# Patient Record
Sex: Male | Born: 1935 | Race: White | Hispanic: No | Smoking: Never smoker
Health system: Southern US, Community
[De-identification: ages and names within clinical notes are randomized; demographics above are authoritative.]

## PROBLEM LIST (undated history)

## (undated) DIAGNOSIS — R001 Bradycardia, unspecified: Secondary | ICD-10-CM

## (undated) DIAGNOSIS — K219 Gastro-esophageal reflux disease without esophagitis: Secondary | ICD-10-CM

## (undated) DIAGNOSIS — I480 Paroxysmal atrial fibrillation: Secondary | ICD-10-CM

## (undated) DIAGNOSIS — I2510 Atherosclerotic heart disease of native coronary artery without angina pectoris: Secondary | ICD-10-CM

## (undated) DIAGNOSIS — I10 Essential (primary) hypertension: Secondary | ICD-10-CM

## (undated) DIAGNOSIS — E785 Hyperlipidemia, unspecified: Secondary | ICD-10-CM

## (undated) DIAGNOSIS — I214 Non-ST elevation (NSTEMI) myocardial infarction: Secondary | ICD-10-CM

## (undated) DIAGNOSIS — C61 Malignant neoplasm of prostate: Secondary | ICD-10-CM

## (undated) DIAGNOSIS — M1990 Unspecified osteoarthritis, unspecified site: Secondary | ICD-10-CM

## (undated) HISTORY — DX: Paroxysmal atrial fibrillation: I48.0

## (undated) HISTORY — PX: CHOLECYSTECTOMY: SHX55

## (undated) HISTORY — PX: PROSTATECTOMY: SHX69

## (undated) HISTORY — DX: Essential (primary) hypertension: I10

## (undated) HISTORY — DX: Malignant neoplasm of prostate: C61

## (undated) HISTORY — DX: Hyperlipidemia, unspecified: E78.5

## (undated) HISTORY — DX: Unspecified osteoarthritis, unspecified site: M19.90

## (undated) HISTORY — DX: Bradycardia, unspecified: R00.1

## (undated) HISTORY — DX: Atherosclerotic heart disease of native coronary artery without angina pectoris: I25.10

## (undated) HISTORY — PX: CORONARY ARTERY BYPASS GRAFT: SHX141

## (undated) HISTORY — DX: Non-ST elevation (NSTEMI) myocardial infarction: I21.4

---

## 1999-05-04 ENCOUNTER — Encounter: Admission: RE | Admit: 1999-05-04 | Discharge: 1999-07-11 | Payer: Self-pay | Admitting: Radiation Oncology

## 2005-12-08 ENCOUNTER — Ambulatory Visit: Payer: Self-pay | Admitting: Cardiology

## 2005-12-25 ENCOUNTER — Ambulatory Visit: Payer: Self-pay | Admitting: Cardiology

## 2010-01-27 ENCOUNTER — Inpatient Hospital Stay (HOSPITAL_COMMUNITY): Admission: EM | Admit: 2010-01-27 | Discharge: 2010-02-01 | Payer: Self-pay | Admitting: Emergency Medicine

## 2010-01-27 ENCOUNTER — Encounter: Payer: Self-pay | Admitting: Cardiology

## 2010-01-27 ENCOUNTER — Ambulatory Visit: Payer: Self-pay | Admitting: Cardiology

## 2010-01-28 ENCOUNTER — Encounter (INDEPENDENT_AMBULATORY_CARE_PROVIDER_SITE_OTHER): Payer: Self-pay | Admitting: Internal Medicine

## 2010-01-28 ENCOUNTER — Encounter: Payer: Self-pay | Admitting: Cardiology

## 2010-01-28 ENCOUNTER — Ambulatory Visit: Payer: Self-pay | Admitting: Vascular Surgery

## 2010-01-30 ENCOUNTER — Encounter: Payer: Self-pay | Admitting: Cardiology

## 2010-01-31 ENCOUNTER — Ambulatory Visit: Payer: Self-pay | Admitting: Cardiology

## 2010-02-01 ENCOUNTER — Encounter: Payer: Self-pay | Admitting: Cardiology

## 2010-02-01 ENCOUNTER — Other Ambulatory Visit: Payer: Self-pay | Admitting: Cardiology

## 2010-02-02 ENCOUNTER — Telehealth (INDEPENDENT_AMBULATORY_CARE_PROVIDER_SITE_OTHER): Payer: Self-pay | Admitting: *Deleted

## 2010-02-03 ENCOUNTER — Ambulatory Visit: Payer: Self-pay | Admitting: Cardiology

## 2010-02-15 ENCOUNTER — Ambulatory Visit: Payer: Self-pay | Admitting: Cardiology

## 2010-02-15 DIAGNOSIS — Z8546 Personal history of malignant neoplasm of prostate: Secondary | ICD-10-CM | POA: Insufficient documentation

## 2010-02-15 DIAGNOSIS — R55 Syncope and collapse: Secondary | ICD-10-CM | POA: Insufficient documentation

## 2010-02-15 DIAGNOSIS — I739 Peripheral vascular disease, unspecified: Secondary | ICD-10-CM

## 2010-02-15 DIAGNOSIS — R001 Bradycardia, unspecified: Secondary | ICD-10-CM

## 2010-02-15 DIAGNOSIS — Z951 Presence of aortocoronary bypass graft: Secondary | ICD-10-CM

## 2010-02-15 DIAGNOSIS — I10 Essential (primary) hypertension: Secondary | ICD-10-CM

## 2010-02-15 DIAGNOSIS — N393 Stress incontinence (female) (male): Secondary | ICD-10-CM

## 2010-02-15 DIAGNOSIS — I2510 Atherosclerotic heart disease of native coronary artery without angina pectoris: Secondary | ICD-10-CM

## 2010-02-28 ENCOUNTER — Encounter: Payer: Self-pay | Admitting: Cardiology

## 2010-03-24 ENCOUNTER — Ambulatory Visit: Payer: Self-pay | Admitting: Cardiology

## 2010-03-24 DIAGNOSIS — I5032 Chronic diastolic (congestive) heart failure: Secondary | ICD-10-CM

## 2010-05-06 ENCOUNTER — Ambulatory Visit: Payer: Self-pay | Admitting: Internal Medicine

## 2010-05-10 ENCOUNTER — Telehealth: Payer: Self-pay | Admitting: Internal Medicine

## 2010-05-10 ENCOUNTER — Encounter: Payer: Self-pay | Admitting: Internal Medicine

## 2010-05-11 HISTORY — PX: OTHER SURGICAL HISTORY: SHX169

## 2010-05-24 ENCOUNTER — Encounter: Payer: Self-pay | Admitting: Internal Medicine

## 2010-05-31 ENCOUNTER — Ambulatory Visit: Payer: Self-pay | Admitting: Internal Medicine

## 2010-05-31 ENCOUNTER — Ambulatory Visit (HOSPITAL_COMMUNITY): Admission: RE | Admit: 2010-05-31 | Discharge: 2010-06-01 | Payer: Self-pay | Admitting: Internal Medicine

## 2010-06-01 ENCOUNTER — Encounter: Payer: Self-pay | Admitting: Internal Medicine

## 2010-06-10 ENCOUNTER — Ambulatory Visit: Payer: Self-pay | Admitting: Internal Medicine

## 2010-09-09 ENCOUNTER — Ambulatory Visit: Payer: Self-pay | Admitting: Internal Medicine

## 2010-09-16 ENCOUNTER — Encounter: Payer: Self-pay | Admitting: Cardiology

## 2010-11-01 ENCOUNTER — Ambulatory Visit: Payer: Self-pay | Admitting: Cardiology

## 2011-01-12 NOTE — Cardiovascular Report (Signed)
Summary: Card Device Clinic/ INTERROGATION REPORT  Card Device Clinic/ INTERROGATION REPORT   Imported By: Dorise Hiss 09/12/2010 14:28:47  _____________________________________________________________________  External Attachment:    Type:   Image     Comment:   External Document

## 2011-01-12 NOTE — Letter (Signed)
Summary: Implantable Device Instructions  Architectural technologist, Main Office  [address]   [place], [place] [address]   Phone: [phone]  Fax: [phone]      Implantable Device Instructions  You are scheduled for:  __X___ Permanent Transvenous Pacemaker _____ Implantable Cardioverter Defibrillator _____ Implantable Loop Recorder _____ Generator Change  on May 31, 2010 with Dr. Johney Frame.  1.  Please arrive at the Short Stay Center at [facility] at 10 AM on the day of your procedure.  2.  Do not eat or drink after midnight the night before your procedure.  3.  Complete lab work on 05-24-10 at the [facility] in [place].   You do not have to be fasting.  4.  Do NOT take these medications for __5__ days prior to your procedure:  PLAVIX.    5.  Plan for an overnight stay.  Bring your insurance cards and a list of your medications.  6.  Wash your chest and neck with antibacterial soap (any brand) the evening before and the morning of your procedure.  Rinse well.   *If you have ANY questions after you get home, please call the office [phone].  *Every attempt is made to prevent procedures from being rescheduled.  Due to the nauture of Electrophysiology, rescheduling can happen.  The physician is always aware and directs the staff when this occurs.

## 2011-01-12 NOTE — Assessment & Plan Note (Signed)
Summary: EST- PER DEGENT FOR PPM   Visit Type:  Initial Consult Primary Provider:  Dimas Aguas  CC:  evaluate need for pacemaker.  History of Present Illness: Bradley Figueroa is an 77 year old male with history of CAD status post coronary bypass grafting who presents today for EP consultation regarding symptomatic bradycardia. The patient was seen several months ago because of an episode of presyncope associated with epigastric discomfort. The patient ruled in for non-ST elevation myocardial infarction.  He had a cath and medical management was recommended.  He was noted to have significant bradycardia with heart rates 30s during his hospitalization.  The patient reports having multiple episodes of fatigue and presyncope since his hospitalization.  A cardiac monitor was placed  and the patient is found to have occasional episodes of bradycardia during the daytime with heart rates in the low 30s. This was documented by auto-trigger however the patient does report several episodes of dizziness during the monitoring period. He has normal ejection fraction documented diastolic dysfunction by echocardiography.  He denies any chest pain orthopnea PND. He denies any palpitations.  He is otherwise without complaint today.  Preventive Screening-Counseling & Management  Alcohol-Tobacco     Smoking Status: never  Current Medications (verified): 1)  Isosorbide Mononitrate Cr 60 Mg Xr24h-Tab (Isosorbide Mononitrate) .... Take 1 Tablet By Mouth Once A Day 2)  Nitrostat 0.4 Mg Subl (Nitroglycerin) .... Use As Directed 3)  Protonix 40 Mg Tbec (Pantoprazole Sodium) .... Take 1 Tablet By Mouth Once A Day 4)  Simvastatin 40 Mg Tabs (Simvastatin) .... Take 1 Tablet By Mouth Once A Day 5)  Plavix 75 Mg Tabs (Clopidogrel Bisulfate) .... Take 1 Tablet By Mouth Once A Day 6)  Ativan 0.5 Mg Tabs (Lorazepam) .... Take 1 Tablet By Mouth Once A Day 7)  Colace 100 Mg Caps (Docusate Sodium) .... Take 1 Tablet By Mouth Once A Day 8)   Tums 500 Mg Chew (Calcium Carbonate Antacid) .... Take 2 Tablet By Mouth Once A Day 9)  Chlorthalidone 25 Mg Tabs (Chlorthalidone) .... Take 1 Tablet By Mouth Once A Day  Allergies (verified): 1)  ! Codeine  Comments:  Nurse/Medical Assistant: The patient's medications and allergies were reviewed with the patient and were updated in the Medication and Allergy Lists. List reviewed.  Past History:  Past Medical History: Status post aortocoronary bypass surgery x 4 in 1991by Dr. Particia Lather History of preserved left ventricular function Hyperlipidemia Hypertension History of vertigo Osteoarthrisit History of prostate cancer Symptomatic bradycardia  Past Surgical History: Reviewed history from 02/15/2010 and no changes required. CABG Cardiac Catheterization Cholecystectomy Prostatectomy  Family History: Reviewed history from 02/15/2010 and no changes required. Both parents are deceased with a history of CAD One sibiling has history of CAD  Social History: Reviewed history from 02/15/2010 and no changes required. Retired  Married  Alcohol Use - no Drug Use - no  Review of Systems       All systems are reviewed and negative except as listed in the HPI.   Vital Signs:  Patient profile:   77 year old male Height:      70 inches Weight:      214 pounds BMI:     30.82 Pulse rate:   52 / minute BP sitting:   126 / 62  (left arm) Cuff size:   regular  Vitals Entered By: Carlye Grippe (May 06, 2010 10:57 AM) CC: evaluate need for pacemaker   Physical Exam  General:  Well developed, well  nourished, in no acute distress. Head:  normocephalic and atraumatic Nose:  no deformity, discharge, inflammation, or lesions Mouth:  Teeth, gums and palate normal. Oral mucosa normal. Neck:  Neck supple, no JVD. No masses, thyromegaly or abnormal cervical nodes. Lungs:  Clear bilaterally to auscultation and percussion. Heart:  Non-displaced PMI, chest non-tender; regular  rate and rhythm, S1, S2 without murmurs, rubs or gallops. Carotid upstroke normal, no bruit. Normal abdominal aortic size, no bruits. Femorals normal pulses, no bruits. Pedals normal pulses. No edema, no varicosities. Abdomen:  Bowel sounds positive; abdomen soft and non-tender without masses, organomegaly, or hernias noted. No hepatosplenomegaly. Msk:  Back normal, normal gait. Muscle strength and tone normal. Pulses:  pulses normal in all 4 extremities Extremities:  1+ ble edema Neurologic:  Alert and oriented x 3. Skin:  Intact without lesions or rashes. Cervical Nodes:  no significant adenopathy Psych:  Normal affect.   EKG  Procedure date:  02/01/2010  Findings:      sinus bradycardia 47 bpm, PR 218,  QTc 412  Impression & Recommendations:  Problem # 1:  BRADYCARDIA (ICD-427.89) The patient presents today with symptomatic bradycardia.  I have reviewed his event monitor which reveals daytime heart rates as low as 30s.  His heart rates are mostly 40s-50s and he reports significant fatigue and frequent presyncope.  Risks, benefits, alternatives to PPM implantation were discussed in detail with the patient today.  He understands that the risks include but are not limited to bleeding, infection, pneumothorax, perforation, tamponade, vascular damage, renal failure, MI, stroke, death, and lead dislodgement. He accepts these risks and wishes to proceed.  We will schedule PPM at the next available time.  He will hold plavix x 5 days prior to the procedure.  Problem # 2:  CHRONIC DIASTOLIC HEART FAILURE (ICD-428.32) stable  Problem # 3:  HYPERTENSION (ICD-401.9) stable  Problem # 4:  CAD (ICD-414.00) stable

## 2011-01-12 NOTE — Procedures (Signed)
Summary: Holter and Event/ CARDIONET END OF SERVICE SUMMARY REPORT  Holter and Event/ CARDIONET END OF SERVICE SUMMARY REPORT   Imported By: Dorise Hiss 03/28/2010 09:34:56  _____________________________________________________________________  External Attachment:    Type:   Image     Comment:   External Document  Appended Document: Holter and Event/ CARDIONET END OF SERVICE SUMMARY REPORT Bradycardia. If recurrent dizziness  or presyncope- referral to EP. Routine f/u for now.  Appended Document: Holter and Event/ CARDIONET END OF SERVICE SUMMARY REPORT Left message to return call.   Appended Document: Holter and Event/ CARDIONET END OF SERVICE SUMMARY REPORT Patient notified.

## 2011-01-12 NOTE — Letter (Signed)
Summary: External Correspondence/ OFFICE VISIT DAYSPRING  External Correspondence/ OFFICE VISIT DAYSPRING   Imported By: Dorise Hiss 09/26/2010 10:48:43  _____________________________________________________________________  External Attachment:    Type:   Image     Comment:   External Document

## 2011-01-12 NOTE — Assessment & Plan Note (Signed)
Summary: 3 MO FU FROM [facility] 6/22 PER C [facility]ERG   Visit Type:  Follow-up Primary Provider:  Dimas Aguas   History of Present Illness: The patient presents today for routine electrophysiology followup. He reports doing very well since having his pacemaker implanted.  He denies further presyncope. The patient denies symptoms of palpitations, chest pain, shortness of breath, orthopnea, PND, lower extremity edema, presyncope, syncope, or neurologic sequela. The patient is tolerating medications without difficulties and is otherwise without complaint today.    Preventive Screening-Counseling & Management  Alcohol-Tobacco     Smoking Status: never  Current Medications (verified): 1)  Isosorbide Mononitrate Cr 60 Mg Xr24h-Tab (Isosorbide Mononitrate) .... Take 1 Tablet By Mouth Once A Day 2)  Nitrostat 0.4 Mg Subl (Nitroglycerin) .... Use As Directed 3)  Protonix 40 Mg Tbec (Pantoprazole Sodium) .... Take 1 Tablet By Mouth Once A Day 4)  Simvastatin 40 Mg Tabs (Simvastatin) .... Take 1 Tablet By Mouth Once A Day 5)  Plavix 75 Mg Tabs (Clopidogrel Bisulfate) .... Take 1 Tablet By Mouth Once A Day 6)  Ativan 0.5 Mg Tabs (Lorazepam) .... Take 1 Tablet By Mouth Once A Day 7)  Colace 100 Mg Caps (Docusate Sodium) .... Take 1 Tablet By Mouth Once A Day 8)  Tums 500 Mg Chew (Calcium Carbonate Antacid) .... Take 2 Tablet By Mouth Once A Day 9)  Chlorthalidone 25 Mg Tabs (Chlorthalidone) .... Take 1 Tablet By Mouth Once A Day  Allergies (verified): 1)  ! Codeine  Comments:  Nurse/Medical Assistant: The patient's medication list and allergies were reviewed with the patient and were updated in the Medication and Allergy Lists.  Past History:  Past Medical History: Status post aortocoronary bypass surgery x 4 in 1991by Dr. Particia Lather History of preserved left ventricular function Hyperlipidemia Hypertension History of vertigo Osteoarthrisit History of prostate cancer Symptomatic bradycardia s/p  PPM 6/11 by Dr Johney Frame  Past Surgical History: CABG Cardiac Catheterization Cholecystectomy Prostatectomy PPM 6/11  Vital Signs:  Patient profile:   76 year old male Height:      70 inches Weight:      219 pounds Pulse rate:   92 / minute BP sitting:   123 / 69  (left arm) Cuff size:   regular  Vitals Entered By: Carlye Grippe (September 09, 2010 2:10 PM)  Physical Exam  General:  Well developed, well nourished, in no acute distress. Head:  normocephalic and atraumatic Eyes:  PERRLA/EOM intact; conjunctiva and lids normal. Mouth:  Teeth, gums and palate normal. Oral mucosa normal. Neck:  supple Chest Wall:  pacemaker pocket is well healed Lungs:  Clear bilaterally to auscultation and percussion. Heart:  RRR Abdomen:  Bowel sounds positive; abdomen soft and non-tender without masses, organomegaly, or hernias noted. No hepatosplenomegaly. Msk:  Back normal, normal gait. Muscle strength and tone normal. Pulses:  pulses normal in all 4 extremities Extremities:  No clubbing or cyanosis. Neurologic:  Alert and oriented x 3.   PPM Specifications Following MD:  Hillis Range, MD     PPM Vendor:  Medtronic     PPM Model Number:  ADDRL1     PPM Serial Number:  [id number] PPM DOI:  05/31/2010     PPM Implanting MD:  Hillis Range, MD  Lead 1    Location: RA     DOI: 05/31/2010     Model #: 2440     Serial #: [id number]     Status: active Lead 2    Location: RV  DOI: 05/31/2010     Model #: 1610     Serial #: [id number]     Status: active  PPM Follow Up Battery Voltage:  2.79 V     Battery Est. Longevity:  10.5 yrs       PPM Device Measurements Atrium  Amplitude: 2.80 mV, Impedance: 617 ohms, Threshold: 0.750 V at 0.40 msec Right Ventricle  Amplitude: 31.36 mV, Impedance: 571 ohms, Threshold: 0.50 V at 0.40 msec  Episodes MS Episodes:  0     Ventricular High Rate:  0     Atrial Pacing:  97.4%     Ventricular Pacing:  0.2%  Parameters Mode:  MVP     Lower Rate Limit:  60      Upper Rate Limit:  120 Paced AV Delay:  180     Sensed AV Delay:  150 Next Cardiology Appt Due:  02/13/2011 Tech Comments:  NORMAL DEVICE FUNCTION.  NO EPISODES SINCE LAST CHECK.  CHANGED RA OUTPUT FROM 3.50 TO 2.00 AND RV OUTPUT FROM 3.50 TO 2.50 V. ROV IN JUNE 2012. Vella Kohler  September 09, 2010 2:59 PM MD Comments:  agree  Impression & Recommendations:  Problem # 1:  BRADYCARDIA (ICD-427.89) s/p PPM doing well as above  Problem # 2:  HYPERTENSION (ICD-401.9) stable no changes today  Problem # 3:  CAD (ICD-414.00) no ischemic symptoms no changes  Patient Instructions: 1)  return 6/12  Appended Document: [facility]     Hypertension History:      Positive major cardiovascular risk factors include male age 59 years old or older and hypertension.  Negative major cardiovascular risk factors include non-tobacco-user status.        Positive history for target organ damage include ASHD (either angina/prior MI/prior CABG), cardiac end organ damage (either CHF or LVH), and peripheral vascular disease.     Allergies: 1)  ! Codeine   PPM Specifications Following MD:  Hillis Range, MD     PPM Vendor:  Medtronic     PPM Model Number:  ADDRL1     PPM Serial Number:  [id number] PPM DOI:  05/31/2010     PPM Implanting MD:  Hillis Range, MD  Lead 1    Location: RA     DOI: 05/31/2010     Model #: 6213     Serial #: [id number]     Status: active Lead 2    Location: RV     DOI: 05/31/2010     Model #: 2952     Serial #: [id number]     Status: active  Parameters Mode:  MVP     Lower Rate Limit:  60     Upper Rate Limit:  120 Paced AV Delay:  180     Sensed AV Delay:  150  Hypertension Assessment/Plan:      The patient's hypertensive risk group is category C: Target organ damage and/or diabetes.   Prescriptions: CHLORTHALIDONE 25 MG TABS (CHLORTHALIDONE) Take 1 tablet by mouth once a day  #90 x 3   Entered by:   Cyril Loosen, RN, BSN   Authorized by:   Lewayne Bunting,  MD, [facility]   Signed by:   Cyril Loosen, RN, BSN on 09/09/2010   Method used:   Electronically to        [facility]* (retail)       [address]       [place], [place]  [address]  Ph: [phone]       Fax: [phone]   RxID:   [id number] PLAVIX 75 MG TABS (CLOPIDOGREL BISULFATE) Take 1 tablet by mouth once a day  #90 x 3   Entered by:   Cyril Loosen, RN, BSN   Authorized by:   Lewayne Bunting, MD, [facility]   Signed by:   Cyril Loosen, RN, BSN on 09/09/2010   Method used:   Electronically to        [facility]* (retail)       [address]       [place], [place]  [address]       Ph: [phone]       Fax: [phone]   RxID:   [id number] SIMVASTATIN 40 MG TABS (SIMVASTATIN) Take 1 tablet by mouth once a day  #90 x 3   Entered by:   Cyril Loosen, RN, BSN   Authorized by:   Lewayne Bunting, MD, [facility]   Signed by:   Cyril Loosen, RN, BSN on 09/09/2010   Method used:   Electronically to        [facility]* (retail)       [address]       [place], [place]  [address]       Ph: [phone]       Fax: [phone]   RxID:   [id number] NITROSTAT 0.4 MG SUBL (NITROGLYCERIN) use as directed  #25 x 3   Entered by:   Cyril Loosen, RN, BSN   Authorized by:   Lewayne Bunting, MD, [facility]   Signed by:   Cyril Loosen, RN, BSN on 09/09/2010   Method used:   Electronically to        [facility]* (retail)       [address]       [place], [place]  [address]       Ph: [phone]       Fax: [phone]   RxID:   [id number] ISOSORBIDE MONONITRATE CR 60 MG XR24H-TAB (ISOSORBIDE MONONITRATE) Take 1 tablet by mouth once a day  #90 x 3   Entered by:   Cyril Loosen, RN, BSN   Authorized by:   Lewayne Bunting, MD, [facility]   Signed by:   Cyril Loosen, RN, BSN on 09/09/2010   Method used:   Electronically to        [facility]* (retail)       [address]       [place], [place]  [address]        Ph: [phone]       Fax: [phone]   RxID:   [id number]

## 2011-01-12 NOTE — Miscellaneous (Signed)
Summary: Orders Update  Clinical Lists Changes  Orders: Added new Test order of T-Basic Metabolic Panel [phone]) - Signed Added new Test order of T-CBC w/Diff [phone]) - Signed Added new Test order of T-Protime, Auto ([phone]) - Signed Added new Test order of T-PTT ([id number]) - Signed

## 2011-01-12 NOTE — Assessment & Plan Note (Signed)
Summary: PACER-MEDTRONIC   Current Medications (verified): 1)  Isosorbide Mononitrate Cr 60 Mg Xr24h-Tab (Isosorbide Mononitrate) .... Take 1 Tablet By Mouth Once A Day 2)  Nitrostat 0.4 Mg Subl (Nitroglycerin) .... Use As Directed 3)  Protonix 40 Mg Tbec (Pantoprazole Sodium) .... Take 1 Tablet By Mouth Once A Day 4)  Simvastatin 40 Mg Tabs (Simvastatin) .... Take 1 Tablet By Mouth Once A Day 5)  Plavix 75 Mg Tabs (Clopidogrel Bisulfate) .... Take 1 Tablet By Mouth Once A Day 6)  Ativan 0.5 Mg Tabs (Lorazepam) .... Take 1 Tablet By Mouth Once A Day 7)  Colace 100 Mg Caps (Docusate Sodium) .... Take 1 Tablet By Mouth Once A Day 8)  Tums 500 Mg Chew (Calcium Carbonate Antacid) .... Take 2 Tablet By Mouth Once A Day 9)  Chlorthalidone 25 Mg Tabs (Chlorthalidone) .... Take 1 Tablet By Mouth Once A Day  Allergies (verified): 1)  ! Codeine   PPM Specifications Following MD:  Hillis Range, MD     PPM Vendor:  Medtronic     PPM Model Number:  ADDRL1     PPM Serial Number:  [id number] PPM DOI:  05/31/2010     PPM Implanting MD:  Hillis Range, MD  Lead 1    Location: RA     DOI: 05/31/2010     Model #: 7425     Serial #: [id number]     Status: active Lead 2    Location: RV     DOI: 05/31/2010     Model #: [id number]     Serial #: [id number]     Status: active  PPM Follow Up Battery Voltage:  2.79 V     Battery Est. Longevity:  77YRS       PPM Device Measurements Atrium  Amplitude: 2.80 mV, Impedance: 633 ohms, Threshold: 0.750 V at 0.40 msec Right Ventricle  Amplitude: 31.36 mV, Impedance: 652 ohms, Threshold: 0.750 V at 0.40 msec  Episodes MS Episodes:  0     Ventricular High Rate:  0     Atrial Pacing:  88.8%     Ventricular Pacing:  <0.1%  Parameters Mode:  MVP     Lower Rate Limit:  60     Upper Rate Limit:  120 Paced AV Delay:  180     Sensed AV Delay:  150 Tech Comments:  WOUND CHECK--STERI STRIPS REMOVED.  DR [facility] LOOKED AT SITE.  RECOMMENDED PUTTING 3 STERI STRIPS ON ONE SMALL  PLACE.  PT AWARE TO CALL IF SITE STARTS DRAINING.  NORMAL DEVICE FUNCTION.  PT FATIGUE AND SOME SOB.  RATE HISTOGRAMS FLAT--CHANGED ACTIVITY THRESHOLD FROM MEDIUM/LOW TO LOW AND ADL AND  EXERTION RESPONSE FROM 3 TO 4.  ROV 09-09-10 @ 1400 W/JA IN [place].  Vella Kohler  June 10, 2010 1:17 PM

## 2011-01-12 NOTE — Progress Notes (Signed)
Summary: Pt Call  Phone Note Call from Patient Call back at Home Phone [phone]   Summary of Call: Pt called the office stating he was dc'd from the hospital yesterday and was suppose to have a monitor put on. I received a call from Ward Givens, NP yesterday stating the patient needs a Cardionet monitor ordered. I have not been able to order monitor yet due to staffing in clinic. I notified pt that monitor will be ordered as soon as possible and will be sent via Fed Ex or UPS to him. He asked how he would put it on. I notified pt that there would be detailed instructions sent with the monitor regarding hook up and that the Cardionet company could also help him with hook up. He asked why he didn't have the monitor yet because he's nervous as a cat inside. I notified pt that due to staff in the office I had not been able to order the monitor yet and would do this as soon as possible. Pt became angry and stated "you can just come to my funeral then."  I again notified pt that I would order monitor as soon as possible but that if he felt he could not wait for the monitor to arrive or felt he needed immediate attention he should go to the ER. Pt asked how was he suppose to know if he should go and again stating that he was nervous inside. I notified pt that if he was having chest pain, SOB, palpitation or other problems that he felt needed to be addressed immediately, then he should go to the ER for evaluation. Pt then stated "bye, just bye" and disconnected the phone call.  Initial call taken by: Cyril Loosen, RN, BSN,  February 02, 2010 2:17 PM     Appended Document: Pt Call Pt received monitor today. His daughter, Terrilyn Saver [employer] teacher at [employer] to office today to ask if we placed monitor. Notified Mrs. Corum that monitor is shipped with detailed instructions for hook-up. She stated she would try to go to pt's home to hook up monitor for him as he is not able to do this on his own.    Also received voicemail from pt's nephew, Jillyn Hidden, who also asked if pt could have help with monitor. Notified Jillyn Hidden that Mrs. Corum stated she would hook up monitor.

## 2011-01-12 NOTE — Assessment & Plan Note (Signed)
Summary: 1 mo fu -srs   Visit Type:  Follow-up Primary Provider:  Dimas Aguas  CC:  follow-up visit.  History of Present Illness: the patient is an 77 year old male with history of there are disease, status post coronary bypass grafting. The patient was seen several months ago because of an episode of presyncope associated with epigastric discomfort. The patient ruled in for non-ST elevation myocardial infarction. He underwent cardiac catheterization it was felt that a stenosis at the insertion of the posterior lateral branch as well as an 80% stenosis within the posterolateral branch distal to the insertion of the bypass graft was a culprit lesion. This was felt to be a complex lesion was treated medically. Of note is that the patient's bypass grafts are more than 75 years old. Initial presentation the patient's heart rate was in the 30s. It was not clear whether this represents a vasovagal episode associated with epigastric discomfort. A cardiac monitor was placed in the interim the patient is found to have occasional episodes of bradycardia during the daytime heart rates in the low 30s. This was documented by auto-trigger however the patient does report several episodes of dizziness during the monitoring period.  The patient also reports more recently increased lower extremity edema. He has normal ejection fraction documented diastolic dysfunction by echocardiography. The patient has difficulty walking and does become easily short of breath. He denies any chest pain orthopnea PND. He denies any palpitations. He continued to endorse symptoms of dizziness and lightheadedness.  Clinical Review Panels:  CXR CXR results   Clinical Data: Chest pain and syncope.    PORTABLE CHEST - 1 VIEW    Comparison: None.    Findings: Prior median sternotomy noted.  Mild cardiomegaly is   present without edema.    The lungs appear clear.    IMPRESSION:    1.  Mild cardiomegaly, without edema.    Read By:   Dellia Cloud,  M.D.    (01/27/2010)  Echocardiogram Echocardiogram Transthoracic Echocardiography  Study Conclusions    - Left ventricle: The cavity size was normal. Wall thickness was     increased in a pattern of moderate LVH. Systolic function was     normal. The estimated ejection fraction was in the range of 55% to     60%. Doppler parameters are consistent with abnormal left     ventricular relaxation (grade 1 diastolic dysfunction).   - Aortic valve: Trivial regurgitation. Valve area: 3.93cm^2(VTI).     Valve area: 3.42cm^2 (Vmax).   - Left atrium: The atrium was mildly dilated.   Impressions:    - Technically difficult; LV function appears to be preserved; focal     wall motion abnormality cannot be excluded; suggest TEE or MUGA if     clinically indicated.   Olga Millers, MD, [facility] (01/28/2010)  Cardiac Imaging Cardiac Cath Findings   CARDIAC CATHETERIZATION   IMPRESSION:  This is an 77 year old with bradycardia, non-ST-elevation   myocardial infarction.  Left heart catheterization today shows severe   native coronary artery disease.  The left internal mammary artery to the   diagonal and the left anterior descending was patent.  The vein graft to   the obtuse marginal 1 and obtuse marginal 2 was totally occluded   ostially (the native circumflex does give rise to a patent obtuse   marginal).  There was a patent saphenous vein graft to the acute   marginal, PDA, and PLV.  The acute marginal branch was patent.  The PDA  touchdown   had been lost.  This graft ended on the PLV.  There was a   90% stenosis in the graft at the insertion site.  There was disease up   to 80% both proximally and distally at the touchdown site in the native   PLV.  The graft backfills several more proximal PLV branches.  The PDA   was fed by collaterals in the LAD.  LV systolic function was preserved.   I discussed the situation with Dr. Riley Kill.  There is a difficult   touchdown lesion  on the PLV with disease both proximally and distally in   the PLV and at the insertion site in the vein graft.  The territory   supplied is not very large.      PLAN:  For now will be medical management and we can readdress   intervention if the patient has ongoing symptoms.              Marca Ancona, MD  (01/31/2010)    Preventive Screening-Counseling & Management  Alcohol-Tobacco     Smoking Status: never  Current Medications (verified): 1)  Isosorbide Mononitrate Cr 60 Mg Xr24h-Tab (Isosorbide Mononitrate) .... Take 1 Tablet By Mouth Once A Day 2)  Nitrostat 0.4 Mg Subl (Nitroglycerin) .... Use As Directed 3)  Protonix 40 Mg Tbec (Pantoprazole Sodium) .... Take 1 Tablet By Mouth Once A Day 4)  Simvastatin 40 Mg Tabs (Simvastatin) .... Take 1 Tablet By Mouth Once A Day 5)  Plavix 75 Mg Tabs (Clopidogrel Bisulfate) .... Take 1 Tablet By Mouth Once A Day 6)  Ativan 0.5 Mg Tabs (Lorazepam) .... Take 1 Tablet By Mouth Once A Day 7)  Colace 100 Mg Caps (Docusate Sodium) .... Take 1 Tablet By Mouth Once A Day 8)  Tums 500 Mg Chew (Calcium Carbonate Antacid) .... Take 2 Tablet By Mouth Once A Day 9)  Chlorthalidone 25 Mg Tabs (Chlorthalidone) .... Take 1 Tablet By Mouth Once A Day  Allergies (verified): 1)  ! Codeine  Comments:  Nurse/Medical Assistant: The patient's medications and allergies were reviewed with the patient and were updated in the Medication and Allergy Lists. Verbally gave names.  Past History:  Past Medical History: Last updated: Mar 07, 2010 Status post aortocoronary bypass surgery x 4 in 1991by Dr. Particia Lather History of preserved left ventricular function Hyperlipidemia Hypertension History of vertigo Osteoarthrisit History of prostate cancer  Past Surgical History: Last updated: 03-07-2010 CABG Cardiac Catheterization Cholecystectomy Prostatectomy  Family History: Last updated: Mar 07, 2010 Both parents are deceased with a history of CAD One  sibiling has history of CAD  Social History: Last updated: 03/07/2010 Retired  Married  Alcohol Use - no Drug Use - no  Risk Factors: Smoking Status: never (03/24/2010)  Review of Systems       The patient complains of fatigue, incontinence, leg swelling, and dizziness.  The patient denies malaise, fever, weight gain/loss, vision loss, decreased hearing, hoarseness, chest pain, palpitations, shortness of breath, prolonged cough, wheezing, sleep apnea, coughing up blood, abdominal pain, blood in stool, nausea, vomiting, diarrhea, heartburn, blood in urine, muscle weakness, joint pain, rash, skin lesions, headache, fainting, depression, anxiety, enlarged lymph nodes, easy bruising or bleeding, and environmental allergies.    Vital Signs:  Patient profile:   77 year old male Height:      70 inches Weight:      227 pounds Pulse rate:   52 / minute BP sitting:   182 / 59  (left arm) Cuff  size:   large  Vitals Entered By: Carlye Grippe (March 24, 2010 9:20 AM) CC: follow-up visit   Physical Exam  Additional Exam:  General: Well-developed, well-nourished in no distress head: Normocephalic and atraumatic eyes PERRLA/EOMI intact, conjunctiva and lids normal nose: No deformity or lesions mouth normal dentition, normal posterior pharynx neck: Supple, no JVD.  No masses, thyromegaly or abnormal cervical nodes lungs: Normal breath sounds bilaterally without wheezing.  Normal percussion heart: regular rate and rhythm with normal S1 and S2, no S3 or S4.  PMI is normal.  No pathological murmurs abdomen: Normal bowel sounds, abdomen is soft and nontender without masses, organomegaly or hernias noted.  No hepatosplenomegaly musculoskeletal: Back normal, normal gait muscle strength and tone normal pulsus: Pulse is normal in all 4 extremities Extremities: 3+ peripheral pitting edema neurologic: Alert and oriented x 3 skin: Intact without lesions or rashes cervical nodes: No significant  adenopathy psychologic: Normal affect    Impression & Recommendations:  Problem # 1:  HYPERTENSION (ICD-401.9) the patient blood pressure is very poorly controlled. He appears to have associated diastolic heart failure. I started her chlorthalidone 25 mg p.o. q. daily. His updated medication list for this problem includes:    Chlorthalidone 25 Mg Tabs (Chlorthalidone) .Marland Kitchen... Take 1 tablet by mouth once a day  Problem # 2:  SYNCOPE AND COLLAPSE (ICD-780.2) the patient does have significant bradycardia with frequent heart rates below 40 beats per minute. It is not clearly documented whether he is symptomatic with these are not. we will refer patient to Dr. Johney Frame for possible evaluation for pacemaker implantation. However it is possible that the patient's dizzy episodes are vasovagal in nature. We will ask his opinion. His updated medication list for this problem includes:    Isosorbide Mononitrate Cr 60 Mg Xr24h-tab (Isosorbide mononitrate) .Marland Kitchen... Take 1 tablet by mouth once a day    Nitrostat 0.4 Mg Subl (Nitroglycerin) ..... Use as directed    Plavix 75 Mg Tabs (Clopidogrel bisulfate) .Marland Kitchen... Take 1 tablet by mouth once a day  Problem # 3:  POSTSURGICAL AORTOCORONARY BYPASS STATUS (ICD-V45.81) status post recent non-ST elevation myocardial infarction responding to medical therapy.  Problem # 4:  CHRONIC DIASTOLIC HEART FAILURE (ICD-428.32) the patient appears to have chronic diastolic heart failure. Diuretic has been started. His updated medication list for this problem includes:    Isosorbide Mononitrate Cr 60 Mg Xr24h-tab (Isosorbide mononitrate) .Marland Kitchen... Take 1 tablet by mouth once a day    Nitrostat 0.4 Mg Subl (Nitroglycerin) ..... Use as directed    Plavix 75 Mg Tabs (Clopidogrel bisulfate) .Marland Kitchen... Take 1 tablet by mouth once a day    Chlorthalidone 25 Mg Tabs (Chlorthalidone) .Marland Kitchen... Take 1 tablet by mouth once a day  Patient Instructions: 1)  Referral to Dr. Johney Frame 2)  Chlorthalidone  25mg  daily 3)  Follow up in  3 months 4)  (script sent to [facility], cancelled at [facility] at pt's request) Prescriptions: CHLORTHALIDONE 25 MG TABS (CHLORTHALIDONE) Take 1 tablet by mouth once a day  #30 x 6   Entered by:   Hoover Brunette, LPN   Authorized by:   Lewayne Bunting, MD, [facility]   Signed by:   Hoover Brunette, LPN on 16/09/9603   Method used:   Electronically to        [facility]* (retail)       [address]       [place], [place]  [address]  Ph: [phone]       Fax: [phone]   RxID:   [id number] CHLORTHALIDONE 25 MG TABS (CHLORTHALIDONE) Take 1 tablet by mouth once a day  #30 x 6   Entered by:   Hoover Brunette, LPN   Authorized by:   Lewayne Bunting, MD, [facility]   Signed by:   Hoover Brunette, LPN on 44/12/270   Method used:   Electronically to        [facility]* (retail)       [address]       [place], [place]  [address]       Ph: [phone]       Fax: [phone]   RxID:   [id number]   Handout requested.

## 2011-01-12 NOTE — Progress Notes (Signed)
Summary: PPM implant  Phone Note Outgoing Call Call back at [facility] Phone [phone]   Call placed by: Bradley Balsam RN BSN,  May 10, 2010 12:24 PM Summary of Call: Called patient and left message on machine to call about PPM implant. Bradley Balsam RN BSN  May 10, 2010 12:25 PM   Follow-up for Phone Call        PT DAUGHTER  RETURNED Bradley Figueroa [phone] or [phone] regarding her father Bradley Figueroa  May 11, 2010 2:05 PM [facility] with instructions and the time for 05/31/10 be at hospital at 10am labs on 05/24/10 at [facility] Dennis Bast, RN, BSN  May 12, 2010 11:22 AM

## 2011-01-12 NOTE — Cardiovascular Report (Signed)
Summary: Office Visit   Office Visit   Imported By: Roderic Ovens 07/07/2010 11:03:52  _____________________________________________________________________  External Attachment:    Type:   Image     Comment:   External Document

## 2011-01-12 NOTE — Consult Note (Signed)
Summary: Consultation Report  Consultation Report   Imported By: Dorise Hiss 02/03/2010 12:06:26  _____________________________________________________________________  External Attachment:    Type:   Image     Comment:   External Document

## 2011-01-12 NOTE — Assessment & Plan Note (Signed)
Summary: 6 MO F/U, PT DUE FOR 3 MO F/U IN JULY 2011-JM   Visit Type:  Follow-up Primary Provider:  Dimas Aguas   History of Present Illness: An 77 year old Caucasian male with history of CAD . He was hospitalized for non-ST-elevation MI earlier this year and was noted to have significant bradycardia with rates into the 30s during hospitalization. Following hospitalization, he has been experiencing fatigue and presyncope and event monitor showed bradycardia. He was seen by Dr. Johney Frame in the office on May 06, 2010, and decision was made to pursue permanent pacemaker placement. the patient has done well and reports no recurrent syncope.  Preventive Screening-Counseling & Management  Alcohol-Tobacco     Smoking Status: never  Current Medications (verified): 1)  Isosorbide Mononitrate Cr 60 Mg Xr24h-Tab (Isosorbide Mononitrate) .... Take 1 Tablet By Mouth Once A Day 2)  Nitrostat 0.4 Mg Subl (Nitroglycerin) .... Use As Directed 3)  Protonix 40 Mg Tbec (Pantoprazole Sodium) .... Take 1 Tablet By Mouth Once A Day 4)  Simvastatin 40 Mg Tabs (Simvastatin) .... Take 1 Tablet By Mouth Once A Day 5)  Plavix 75 Mg Tabs (Clopidogrel Bisulfate) .... Take 1 Tablet By Mouth Once A Day 6)  Ativan 0.5 Mg Tabs (Lorazepam) .... Take 1 Tablet By Mouth Once A Day 7)  Colace 100 Mg Caps (Docusate Sodium) .... Take 1 Tablet By Mouth Once A Day 8)  Tums 500 Mg Chew (Calcium Carbonate Antacid) .... Take 2 Tablet By Mouth Once A Day 9)  Chlorthalidone 25 Mg Tabs (Chlorthalidone) .... Take 1 Tablet By Mouth Once A Day 10)  Doxycycline Hyclate 100 Mg Caps (Doxycycline Hyclate) .... Take 1 Tablet By Mouth Once A Day With Food 11)  Aspir-Low 81 Mg Tbec (Aspirin) .... Take 1 Tablet By Mouth Once A Day  Allergies (verified): 1)  ! Codeine  Comments:  Nurse/Medical Assistant: The patient is currently on medications but does not know the name or dosage at this time. Instructed to contact our office with details.  Will update medication list at that time.  Past History:  Past Medical History: Last updated: 09/09/2010 Status post aortocoronary bypass surgery x 4 in 1991by Dr. 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 by Dr Johney Frame  Past Surgical History: Last updated: 09/09/2010 CABG Cardiac Catheterization Cholecystectomy Prostatectomy PPM 6/11  Family History: Last updated: 03/14/10 Both parents are deceased with a history of CAD One sibiling has history of CAD  Social History: Last updated: 2010-03-14 Retired  Married  Alcohol Use - no Drug Use - no  Risk Factors: Smoking Status: never (11/01/2010)  Vital Signs:  Patient profile:   77 year old male Height:      70 inches Weight:      218 pounds Pulse rate:   79 / minute BP sitting:   130 / 70  (left arm) Cuff size:   large  Vitals Entered By: Carlye Grippe (November 01, 2010 11:10 AM)  Physical Exam  Additional Exam:  General: Well-developed, well-nourished in no distress head: Normocephalic and atraumatic eyes PERRLA/EOMI intact, conjunctiva and lids normal nose: No deformity or lesions mouth normal dentition, normal posterior pharynx neck: Supple, no JVD.  No masses, thyromegaly or abnormal cervical nodes lungs: Normal breath sounds bilaterally without wheezing.  Normal percussion heart: regular rate and rhythm with normal S1 and S2, no S3 or S4.  PMI is normal.  No pathological murmurs abdomen: Normal  bowel sounds, abdomen is soft and nontender without masses, organomegaly or hernias noted.  No hepatosplenomegaly musculoskeletal: Back normal, normal gait muscle strength and tone normal pulsus: Pulse is normal in all 4 extremities Extremities: 3+ peripheral pitting edema neurologic: Alert and oriented x 3 skin: Intact without lesions or rashes cervical nodes: No  significant adenopathy psychologic: Normal affect    PPM Specifications Following MD:  Hillis Range, MD     PPM Vendor:  [employer]     PPM Model Number:  [id number]     PPM Serial Number:  [id number] PPM DOI:  05/31/2010     PPM Implanting MD:  Hillis Range, MD  Lead 1    Location: [facility]     DOI: 05/31/2010     Model #: [id number]     Serial #: [id number]     Status: active Lead 2    Location: RV     DOI: 05/31/2010     Model #: [id number]     Serial #: [id number]     Status: active  Parameters Mode:  MVP     Lower Rate Limit:  60     Upper Rate Limit:  120 Paced AV Delay:  180     Sensed AV Delay:  150  Impression & Recommendations:  Problem # 1:  BRADYCARDIA (ICD-427.89) status-post pacemaker implantation. His updated medication list for this problem includes:    Isosorbide Mononitrate Cr 60 Mg Xr24h-tab (Isosorbide mononitrate) .Marland Kitchen... Take 1 tablet by mouth once a day    Nitrostat 0.4 Mg Subl (Nitroglycerin) ..... Use as directed    Plavix 75 Mg Tabs (Clopidogrel bisulfate) .Marland Kitchen... Take 1 tablet by mouth once a day    Aspir-low 81 Mg Tbec (Aspirin) .Marland Kitchen... Take 1 tablet by mouth once a day  Problem # 2:  CHRONIC DIASTOLIC HEART FAILURE (ICD-428.32) no evidence of volume overload continue medical therapy His updated medication list for this problem includes:    Isosorbide Mononitrate Cr 60 Mg Xr24h-tab (Isosorbide mononitrate) .Marland Kitchen... Take 1 tablet by mouth once a day    Nitrostat 0.4 Mg Subl (Nitroglycerin) ..... Use as directed    Plavix 75 Mg Tabs (Clopidogrel bisulfate) .Marland Kitchen... Take 1 tablet by mouth once a day    Chlorthalidone 25 Mg Tabs (Chlorthalidone) .Marland Kitchen... Take 1 tablet by mouth once a day    Aspir-low 81 Mg Tbec (Aspirin) .Marland Kitchen... Take 1 tablet by mouth once a day  Problem # 3:  CAD (ICD-414.00) stable no recurrent chest pain.  Continue Plavix His updated medication list for this problem includes:    Isosorbide Mononitrate Cr 60 Mg Xr24h-tab (Isosorbide mononitrate) .Marland Kitchen... Take 1 tablet by mouth  once a day    Nitrostat 0.4 Mg Subl (Nitroglycerin) ..... Use as directed    Plavix 75 Mg Tabs (Clopidogrel bisulfate) .Marland Kitchen... Take 1 tablet by mouth once a day    Aspir-low 81 Mg Tbec (Aspirin) .Marland Kitchen... Take 1 tablet by mouth once a day  Patient Instructions: 1)  Your physician recommends that you continue on your current medications as directed. Please refer to the Current Medication list given to you today. 2)  Follow up in  6 months

## 2011-01-12 NOTE — Assessment & Plan Note (Signed)
Summary: EPH-PER C.BERG WITHIN 10 DAYS   Visit Type:  hospital follow-up Primary Provider:  Dimas Aguas  CC:  hospital follow-up visit.  History of Present Illness: the patient is an 77 year old male with prior history of coronary artery disease, status-post coronary bypass grafting.  On February 18 the patient while at [facility] developed epigastric discomfort followed by presyncope.  The patient ruled in for non-ST segment elevation myocardial infarction.  He underwent cardiac catheterization.  There was a 90% stenosis at the insertion of the posterior lateral branch with an 80% stenosis within the posterior lateral branch distal to the insertion site of the bypass graft.  This was felt to be a complex lesion and the patient was medically treated.  Of note is that his bypass graft was more than 75 years old.  Of note also was that the patient had initial presentation had a heart rate in the 30s.  Is not entirely clear if this was a vasovagal episode associated with this epigastric discomfort.  The patient reports some dizziness but no recurrent presyncope or syncope.  He also has chronic stable angina.  Preventive Screening-Counseling & Management  Alcohol-Tobacco     Smoking Status: never  Clinical Review Panels:  CXR CXR results   Clinical Data: Chest pain and syncope.    PORTABLE CHEST - 1 VIEW    Comparison: None.    Findings: Prior median sternotomy noted.  Mild cardiomegaly is   present without edema.    The lungs appear clear.    IMPRESSION:    1.  Mild cardiomegaly, without edema.    Read By:  Dellia Cloud,  M.D.    (01/27/2010)  Echocardiogram Echocardiogram Transthoracic Echocardiography  Study Conclusions    - Left ventricle: The cavity size was normal. Wall thickness was     increased in a pattern of moderate LVH. Systolic function was     normal. The estimated ejection fraction was in the range of 55% to     60%. Doppler parameters are consistent with  abnormal left     ventricular relaxation (grade 1 diastolic dysfunction).   - Aortic valve: Trivial regurgitation. Valve area: 3.93cm^2(VTI).     Valve area: 3.42cm^2 (Vmax).   - Left atrium: The atrium was mildly dilated.   Impressions:    - Technically difficult; LV function appears to be preserved; focal     wall motion abnormality cannot be excluded; suggest TEE or MUGA if     clinically indicated.   Olga Millers, MD, [facility] (01/28/2010)  Cardiac Imaging Cardiac Cath Findings   CARDIAC CATHETERIZATION   IMPRESSION:  This is an 78 year old with bradycardia, non-ST-elevation   myocardial infarction.  Left heart catheterization today shows severe   native coronary artery disease.  The left internal mammary artery to the   diagonal and the left anterior descending was patent.  The vein graft to   the obtuse marginal 1 and obtuse marginal 2 was totally occluded   ostially (the native circumflex does give rise to a patent obtuse   marginal).  There was a patent saphenous vein graft to the acute   marginal, PDA, and PLV.  The acute marginal branch was patent.  The PDA touchdown   had been lost.  This graft ended on the PLV.  There was a   90% stenosis in the graft at the insertion site.  There was disease up   to 80% both proximally and distally at the touchdown site in the native  PLV.  The graft backfills several more proximal PLV branches.  The PDA   was fed by collaterals in the LAD.  LV systolic function was preserved.   I discussed the situation with Dr. Riley Kill.  There is a difficult   touchdown lesion on the PLV with disease both proximally and distally in   the PLV and at the insertion site in the vein graft.  The territory   supplied is not very large.      PLAN:  For now will be medical management and we can readdress   intervention if the patient has ongoing symptoms.              Marca Ancona, MD  (01/31/2010)    Current Medications (verified): 1)  Isosorbide  Mononitrate Cr 60 Mg Xr24h-Tab (Isosorbide Mononitrate) .... Take 1 Tablet By Mouth Once A Day (Place On File) 2)  Nitrostat 0.4 Mg Subl (Nitroglycerin) .... Use As Directed 3)  Protonix 40 Mg Tbec (Pantoprazole Sodium) .... Take 1 Tablet By Mouth Once A Day 4)  Simvastatin 40 Mg Tabs (Simvastatin) .... Take 1 Tablet By Mouth Once A Day 5)  Plavix 75 Mg Tabs (Clopidogrel Bisulfate) .... Take 1 Tablet By Mouth Once A Day 6)  Ativan 0.5 Mg Tabs (Lorazepam) .... Take 1 Tablet By Mouth Once A Day 7)  Colace 100 Mg Caps (Docusate Sodium) .... Take 1 Tablet By Mouth Once A Day 8)  Tums 500 Mg Chew (Calcium Carbonate Antacid) .... Take 2 Tablet By Mouth Once A Day  Allergies (verified): 1)  ! Codeine  Comments:  Nurse/Medical Assistant: The patient's medications and allergies were reviewed with the patient and were updated in the Medication and Allergy Lists. List reviewed.  Past History:  Past Medical History: Last updated: 02/23/2010 Status post aortocoronary bypass surgery x 4 in 1991by Dr. Particia Lather History of preserved left ventricular function Hyperlipidemia Hypertension History of vertigo Osteoarthrisit History of prostate cancer  Past Surgical History: Last updated: 02-23-2010 CABG Cardiac Catheterization Cholecystectomy Prostatectomy  Family History: Last updated: 23-Feb-2010 Both parents are deceased with a history of CAD One sibiling has history of CAD  Social History: Last updated: 02-23-2010 Retired  Married  Alcohol Use - no Drug Use - no  Risk Factors: Smoking Status: never (Feb 23, 2010)  Social History: Smoking Status:  never  Review of Systems       The patient complains of chest pain and dizziness.  The patient denies fatigue, malaise, fever, weight gain/loss, vision loss, decreased hearing, hoarseness, palpitations, shortness of breath, prolonged cough, wheezing, sleep apnea, coughing up blood, abdominal pain, blood in stool, nausea, vomiting,  diarrhea, heartburn, incontinence, blood in urine, muscle weakness, joint pain, leg swelling, rash, skin lesions, headache, fainting, depression, anxiety, enlarged lymph nodes, easy bruising or bleeding, and environmental allergies.    Vital Signs:  Patient profile:   77 year old male Height:      70 inches Weight:      222 pounds Pulse rate:   48 / minute BP sitting:   146 / 76  (left arm) Cuff size:   large  Vitals Entered By: Carlye Grippe 02/23/2010 10:31 AM) CC: hospital follow-up visit   Physical Exam  Additional Exam:  General: Well-developed, well-nourished in no distress head: Normocephalic and atraumatic eyes PERRLA/EOMI intact, conjunctiva and lids normal nose: No deformity or lesions mouth normal dentition, normal posterior pharynx neck: Supple, no JVD.  No masses, thyromegaly or abnormal cervical nodes lungs: Normal breath  sounds bilaterally without wheezing.  Normal percussion heart: regular rate and rhythm with normal S1 and S2, no S3 or S4.  PMI is normal.  No pathological murmurs abdomen: Normal bowel sounds, abdomen is soft and nontender without masses, organomegaly or hernias noted.  No hepatosplenomegaly musculoskeletal: Back normal, normal gait muscle strength and tone normal pulsus: Pulse is normal in all 4 extremities Extremities: No peripheral pitting edema neurologic: Alert and oriented x 3 skin: Intact without lesions or rashes cervical nodes: No significant adenopathy psychologic: Normal affect    Impression & Recommendations:  Problem # 1:  CAD (ICD-414.00) the patient has significant coronary artery disease and is status post non-ST segment elevation myocardial infarction.  He will be continued on Plavix.  As well a statin drug therapy.isosorbide mononitrate was increased to 60 mg p.o. daily The following medications were removed from the medication list:    Aspir-low 81 Mg Tbec (Aspirin) .Marland Kitchen... Take 1 tablet by mouth once a day     Lisinopril 20 Mg Tabs (Lisinopril) .Marland Kitchen... Take 1 tablet by mouth once a day His updated medication list for this problem includes:    Isosorbide Mononitrate Cr 60 Mg Xr24h-tab (Isosorbide mononitrate) .Marland Kitchen... Take 1 tablet by mouth once a day (place on file)    Nitrostat 0.4 Mg Subl (Nitroglycerin) ..... Use as directed    Plavix 75 Mg Tabs (Clopidogrel bisulfate) .Marland Kitchen... Take 1 tablet by mouth once a day  Problem # 2:  SYNCOPE AND COLLAPSE (ICD-780.2) the patient had presyncope associated with heart rate in the 30s.  Reportedly the patient had a CardioNet monitor and we will try to obtain those results. The following medications were removed from the medication list:    Aspir-low 81 Mg Tbec (Aspirin) .Marland Kitchen... Take 1 tablet by mouth once a day    Lisinopril 20 Mg Tabs (Lisinopril) .Marland Kitchen... Take 1 tablet by mouth once a day His updated medication list for this problem includes:    Isosorbide Mononitrate Cr 60 Mg Xr24h-tab (Isosorbide mononitrate) .Marland Kitchen... Take 1 tablet by mouth once a day (place on file)    Nitrostat 0.4 Mg Subl (Nitroglycerin) ..... Use as directed    Plavix 75 Mg Tabs (Clopidogrel bisulfate) .Marland Kitchen... Take 1 tablet by mouth once a day  Problem # 3:  HYPERTENSION (ICD-401.9) Assessment: Comment Only  The following medications were removed from the medication list:    Aspir-low 81 Mg Tbec (Aspirin) .Marland Kitchen... Take 1 tablet by mouth once a day    Lisinopril 20 Mg Tabs (Lisinopril) .Marland Kitchen... Take 1 tablet by mouth once a day  Problem # 4:  POSTSURGICAL AORTOCORONARY BYPASS STATUS (ICD-V45.81) Assessment: Comment Only  Patient Instructions: 1)  Increase Imdur to 60mg  daily 2)  Follow up in  3-4 weeks.   Prescriptions: ISOSORBIDE MONONITRATE CR 60 MG XR24H-TAB (ISOSORBIDE MONONITRATE) Take 1 tablet by mouth once a day (place on file)  #30 x 6   Entered by:   Hoover Brunette, LPN   Authorized by:   Lewayne Bunting, MD, [facility]   Signed by:   Hoover Brunette, LPN on 16/09/9603   Method used:   Electronically to         [facility]* (retail)       [address]       [place], [place]  [address]       Ph: [phone]       Fax: [phone]   RxID:   [id number]

## 2011-01-12 NOTE — Cardiovascular Report (Signed)
Summary: Pre-Op Orders  Pre-Op Orders   Imported By: Debby Freiberg 06/04/2010 11:03:26  _____________________________________________________________________  External Attachment:    Type:   Image     Comment:   External Document

## 2011-03-02 LAB — COMPREHENSIVE METABOLIC PANEL
ALT: 15 U/L (ref 0–53)
AST: 31 U/L (ref 0–37)
BUN: 26 mg/dL — ABNORMAL HIGH (ref 6–23)
Calcium: 9.7 mg/dL (ref 8.4–10.5)
Creatinine, Ser: 1.15 mg/dL (ref 0.4–1.5)
GFR calc Af Amer: >60 mL/min (ref >60)
GFR calc non Af Amer: >60 mL/min (ref >60)
Glucose, Bld: 173 mg/dL — ABNORMAL HIGH (ref 70–99)
Total Bilirubin: 0.4 mg/dL (ref 0.3–1.2)

## 2011-03-02 LAB — POCT CARDIAC MARKERS
CKMB, poc: 4.1 ng/mL (ref 1.0–8.0)
Myoglobin, poc: 477 ng/mL (ref 12–200)

## 2011-03-02 LAB — DIFFERENTIAL
Eosinophils Absolute: 0 10*3/uL (ref 0.0–0.7)
Lymphocytes Relative: 7 % — ABNORMAL LOW (ref 12–46)
Neutrophils Relative %: 92 % — ABNORMAL HIGH (ref 43–77)

## 2011-03-02 LAB — URINALYSIS, ROUTINE W REFLEX MICROSCOPIC
Glucose, UA: NEGATIVE mg/dL
Ketones, ur: NEGATIVE mg/dL
pH: 6 (ref 5.0–8.0)

## 2011-03-02 LAB — CBC
HCT: 37.8 % — ABNORMAL LOW (ref 39.0–52.0)
HCT: 40.3 % (ref 39.0–52.0)
HCT: 40.5 % (ref 39.0–52.0)
Hemoglobin: 13 g/dL (ref 13.0–17.0)
Hemoglobin: 13.3 g/dL (ref 13.0–17.0)
Hemoglobin: 13.7 g/dL (ref 13.0–17.0)
MCV: 95.4 fL (ref 78.0–100.0)
Platelets: 148 10*3/uL — ABNORMAL LOW (ref 150–400)
Platelets: 167 10*3/uL (ref 150–400)
RBC: 4.07 MIL/uL — ABNORMAL LOW (ref 4.22–5.81)
RDW: 13.2 % (ref 11.5–15.5)
RDW: 13.3 % (ref 11.5–15.5)
RDW: 13.7 % (ref 11.5–15.5)
WBC: 16.1 10*3/uL — ABNORMAL HIGH (ref 4.0–10.5)
WBC: 8.6 10*3/uL (ref 4.0–10.5)

## 2011-03-02 LAB — CULTURE, BLOOD (ROUTINE X 2)

## 2011-03-02 LAB — BASIC METABOLIC PANEL
BUN: 24 mg/dL — ABNORMAL HIGH (ref 6–23)
CO2: 25 mEq/L (ref 19–32)
Calcium: 8.3 mg/dL — ABNORMAL LOW (ref 8.4–10.5)
Chloride: 108 mEq/L (ref 96–112)
Chloride: 109 mEq/L (ref 96–112)
GFR calc Af Amer: >60 mL/min (ref >60)
GFR calc non Af Amer: 50 mL/min — ABNORMAL LOW (ref >60)
Potassium: 4.2 mEq/L (ref 3.5–5.1)
Potassium: 4.8 mEq/L (ref 3.5–5.1)
Sodium: 137 mEq/L (ref 135–145)
Sodium: 137 mEq/L (ref 135–145)

## 2011-03-02 LAB — URINE CULTURE: Special Requests: NEGATIVE

## 2011-03-02 LAB — CK TOTAL AND CKMB (NOT AT ARMC)
CK, MB: 9.6 ng/mL (ref 0.3–4.0)
Relative Index: 4.3 — ABNORMAL HIGH (ref 0.0–2.5)

## 2011-03-02 LAB — MAGNESIUM: Magnesium: 2.3 mg/dL (ref 1.5–2.5)

## 2011-03-02 LAB — CARDIAC PANEL(CRET KIN+CKTOT+MB+TROPI)
CK, MB: 2.4 ng/mL (ref 0.3–4.0)
CK, MB: 8 ng/mL (ref 0.3–4.0)
CK, MB: 8.9 ng/mL (ref 0.3–4.0)
Relative Index: 4.1 — ABNORMAL HIGH (ref 0.0–2.5)
Relative Index: 5.5 — ABNORMAL HIGH (ref 0.0–2.5)
Relative Index: 6.6 — ABNORMAL HIGH (ref 0.0–2.5)
Total CK: 219 U/L (ref 7–232)
Total CK: 31 U/L (ref 7–232)
Troponin I: 0.1 ng/mL — ABNORMAL HIGH (ref 0.00–0.06)
Troponin I: 0.12 ng/mL — ABNORMAL HIGH (ref 0.00–0.06)
Troponin I: 0.14 ng/mL — ABNORMAL HIGH (ref 0.00–0.06)

## 2011-03-02 LAB — PROTIME-INR
INR: 1.06 (ref 0.00–1.49)
Prothrombin Time: 13.7 seconds (ref 11.6–15.2)

## 2011-03-02 LAB — URINE MICROSCOPIC-ADD ON

## 2011-03-02 LAB — LIPID PANEL
Triglycerides: 59 mg/dL (ref <150)
VLDL: 12 mg/dL (ref 0–40)

## 2011-03-02 LAB — HEMOGLOBIN A1C: Mean Plasma Glucose: 117 mg/dL

## 2011-03-02 LAB — AMYLASE: Amylase: 29 U/L (ref 0–105)

## 2011-03-03 LAB — BASIC METABOLIC PANEL
GFR calc Af Amer: >60 mL/min (ref >60)
GFR calc non Af Amer: >60 mL/min (ref >60)
Glucose, Bld: 96 mg/dL (ref 70–99)
Potassium: 4.2 mEq/L (ref 3.5–5.1)
Sodium: 135 mEq/L (ref 135–145)

## 2011-03-03 LAB — CBC
HCT: 35.5 % — ABNORMAL LOW (ref 39.0–52.0)
Hemoglobin: 12.1 g/dL — ABNORMAL LOW (ref 13.0–17.0)
RBC: 3.74 MIL/uL — ABNORMAL LOW (ref 4.22–5.81)
RDW: 13.2 % (ref 11.5–15.5)

## 2011-04-17 DIAGNOSIS — R55 Syncope and collapse: Secondary | ICD-10-CM

## 2011-04-18 DIAGNOSIS — I2510 Atherosclerotic heart disease of native coronary artery without angina pectoris: Secondary | ICD-10-CM

## 2011-04-27 ENCOUNTER — Encounter: Payer: Self-pay | Admitting: Cardiology

## 2011-05-23 ENCOUNTER — Encounter: Payer: Self-pay | Admitting: Cardiology

## 2011-06-05 ENCOUNTER — Encounter: Payer: Self-pay | Admitting: Cardiology

## 2011-06-05 ENCOUNTER — Ambulatory Visit (INDEPENDENT_AMBULATORY_CARE_PROVIDER_SITE_OTHER): Payer: Medicare Other | Admitting: Cardiology

## 2011-06-05 VITALS — BP 132/69 | HR 76 | Ht 70.0 in | Wt 222.0 lb

## 2011-06-05 DIAGNOSIS — I2510 Atherosclerotic heart disease of native coronary artery without angina pectoris: Secondary | ICD-10-CM

## 2011-06-05 DIAGNOSIS — R55 Syncope and collapse: Secondary | ICD-10-CM

## 2011-06-05 DIAGNOSIS — I509 Heart failure, unspecified: Secondary | ICD-10-CM

## 2011-06-05 DIAGNOSIS — I5032 Chronic diastolic (congestive) heart failure: Secondary | ICD-10-CM

## 2011-06-05 MED ORDER — AMLODIPINE BESYLATE 5 MG PO TABS: 5.0000 mg | ORAL_TABLET | Freq: Every day | ORAL | Status: DC

## 2011-06-05 MED ORDER — ISOSORBIDE MONONITRATE ER 60 MG PO TB24: ORAL_TABLET | ORAL | Status: DC

## 2011-06-05 NOTE — Progress Notes (Signed)
HPI  The patient is an 77 year old male with history of coronary artery disease, status post coronary bypass grafting, hypertension hyperlipidemia and prior pacemaker placement. Has a history of diastolic congestive heart failure as well as prostate cancer. The patient was admitted last month because of a episode of syncope with the retrospect appear to be related to dehydration. Recommendations were to stop the diuretic. The patient has significant coronary artery disease with his last catheterization performed February 2011. The right coronary artery was totally occluded he had a 50% left main lesion the circumflex was patent. There was a 50% obtuse marginal and another marginal that had been grafted the native vessel is occluded. The LAD was occluded there was sequential saphenous vein graft to the acute marginal PDA and a posterolateral. The touchdown to the acute marginal was patent. The touchdown to the PDA was occluded in the touchdown to the posterolateral branch was patent. There was also 90% stenosis in the vein graft to the touchdown site. There was an 80% lesion in the posterior lateral branch. The saphenous vein graft to the first and second obtuse marginal was totally occluded. The LIMA to the diagonal and the LAD was patent. Recommendations were for medical therapy. During his recent hospitalization the patient underwent an echocardiogram which demonstrated a normal ejection fraction but the study was felt to be technically difficult. Ejection fraction was estimated at 55-60%. The patient also underwent a Cardiolite stress test demonstrating significant reversibility with moderate ischemia in a moderate area of the myocardium affecting the base and mid inferolateral wall. The patient also had a pacemaker evaluation the setting of his syncope. There appeared to be normal pacemaker function. The patient has a dual-chamber Medtronic device. Of note is that the patient state that prior to this  hospitalization he had significant angina. The patient's angina symptoms are associated with neck pain as well as bilateral jaw pain. This pattern has been relatively stable although slightly increased over the last several months. The patient states that he does not have to walk very far before he starts developing symptoms in the neck and jaw area. Although he has nitroglycerin is actually never used it.  Allergies  Allergen Reactions  . Codeine     Current Outpatient Prescriptions on File Prior to Visit  Medication Sig Dispense Refill  . aspirin (ASPIR-LOW) 81 MG EC tablet Take 81 mg by mouth daily.        . calcium carbonate (TUMS) 500 MG chewable tablet Chew 2 tablets by mouth daily.        . clopidogrel (PLAVIX) 75 MG tablet Take 75 mg by mouth daily.        Marland Kitchen docusate sodium (COLACE) 100 MG capsule Take 100 mg by mouth daily.        Marland Kitchen LORazepam (ATIVAN) 0.5 MG tablet Take 0.5 mg by mouth daily.        . nitroGLYCERIN (NITROSTAT) 0.4 MG SL tablet Place 0.4 mg under the tongue every 5 (five) minutes as needed.        . pantoprazole (PROTONIX) 40 MG tablet Take 40 mg by mouth daily.        . simvastatin (ZOCOR) 40 MG tablet Take 40 mg by mouth daily.        Marland Kitchen DISCONTD: chlorthalidone (HYGROTON) 25 MG tablet Take 25 mg by mouth daily.        Marland Kitchen DISCONTD: isosorbide mononitrate (IMDUR) 60 MG 24 hr tablet Take 60 mg by mouth daily.  Past Medical History  Diagnosis Date  . Hyperlipidemia   . Hypertension   . Vertigo   . Osteoarthritis   . Prostate cancer   . Symptomatic bradycardia     Past Surgical History  Procedure Date  . Coronary artery bypass graft   . Cardiac catheterization   . Cholecystectomy   . Prostatectomy   . Ppm 6/11    Family History  Problem Relation Age of Onset  . Coronary artery disease Mother   . Coronary artery disease Father   . Coronary artery disease Other     History   Social History  . Marital Status: Married    Spouse Name: N/A     Number of Children: N/A  . Years of Education: N/A   Occupational History  . Not on file.   Social History Main Topics  . Smoking status: Current Everyday Smoker  . Smokeless tobacco: Never Used  . Alcohol Use: No  . Drug Use: No  . Sexually Active: Not on file   Other Topics Concern  . Not on file   Social History Narrative   Retired and married.     BJY:NWGNFAOZH positives as outlined above. The remainder of the 18  point review of systems is negative  PHYSICAL EXAM BP 132/69  Pulse 76  Ht 5\' 10"  (1.778 m)  Wt 222 lb (100.699 kg)  BMI 31.85 kg/m2  General: Well-developed, well-nourished in no distress Head: Normocephalic and atraumatic Eyes:PERRLA/EOMI intact, conjunctiva and lids normal Ears: No deformity or lesions Mouth:normal dentition, normal posterior pharynx Neck: Supple, no JVD.  No masses, thyromegaly or abnormal cervical nodes Lungs: Normal breath sounds bilaterally without wheezing.  Normal percussion Chest: Well-healed midline sternotomy scar. Pocket site no evidence of infection or erythema. Cardiac: regular rate and rhythm with normal S1 and S2, no S3 or S4.  PMI is normal.  No pathological murmurs Abdomen: Normal bowel sounds, abdomen is soft and nontender without masses, organomegaly or hernias noted.  No hepatosplenomegaly MSK: Back normal, normal gait muscle strength and tone normal Vascular: Pulse is normal in all 4 extremities Extremities: No peripheral pitting edema Neurologic: Alert and oriented x 3 Skin: Intact without lesions or rashes Lymphatics: No significant adenopathy Psychologic: Normal affect   ECG: Not available  ASSESSMENT AND PLAN

## 2011-06-05 NOTE — Assessment & Plan Note (Signed)
Syncope was felt to be related secondary to dehydration. Pacemaker was interrogated. As per recommendations of Dr. Jens Som I stopped his diuretic also today. There was no pacemaker malfunction.

## 2011-06-05 NOTE — Patient Instructions (Signed)
Your physician wants you to follow-up in: 6 months. You will receive a reminder letter in the mail one-two months in advance. If you don't receive a letter, please call our office to schedule the follow-up appointment. Start Amlodipine 5 mg daily. Increase Imdur (isosorbide) to 90 mg daily. This will be 1 1/2 of your 60 mg tablets. A new prescription has been sent to [facility]. Stop Chlorthalidone.

## 2011-06-05 NOTE — Assessment & Plan Note (Addendum)
Significant coronary artery disease with a positive Cardiolite study for ischemia. The patient has exertional angina symptoms. Given his underlying renal insufficiency and is relatively stable pattern we'll continue with medical therapy. Also we are taking his advanced age into account. To improve his angina coverage at increased isosorbide mononitrate  to 90 mg daily and added Norvasc 5 mg by mouth daily. I also discontinued his diuretic to make sure he does not become hypotensive and it appears that his last episode of syncope was associated with dehydration. The patient does report some lower extremity edema usually late in the afternoon because he is sitting down in a chair most of the day. I told him that he should remain active but that this type of edema is not consistent with congestive heart failure. He also has problems with frequent urination and urinary incontinence. I also told the patient to make sure that he takes PRN nitroglycerin when needed

## 2011-06-05 NOTE — Assessment & Plan Note (Signed)
No evidence of heart failure. 

## 2011-06-26 ENCOUNTER — Other Ambulatory Visit: Payer: Self-pay | Admitting: *Deleted

## 2011-06-26 MED ORDER — AMLODIPINE BESYLATE 5 MG PO TABS: 5.0000 mg | ORAL_TABLET | Freq: Every day | ORAL | Status: DC

## 2011-08-04 ENCOUNTER — Encounter: Payer: Medicare Other | Admitting: Internal Medicine

## 2011-10-18 ENCOUNTER — Telehealth: Payer: Self-pay | Admitting: *Deleted

## 2011-10-18 NOTE — Telephone Encounter (Signed)
Patient had question about his Metoprolol.  States he has been taking Metoprolol Tart 25mg  1/2 tab twice a day.  His PMD did 90 day refill for him, but directions on bottle stated one tab daily.  Advised him to continue taking the way we have documented per last office note.  Scheduled him for 6 mo fu for 12/17 with GD.

## 2011-11-27 ENCOUNTER — Encounter: Payer: Self-pay | Admitting: Cardiology

## 2011-11-27 ENCOUNTER — Ambulatory Visit (INDEPENDENT_AMBULATORY_CARE_PROVIDER_SITE_OTHER): Payer: Medicare Other | Admitting: Cardiology

## 2011-11-27 VITALS — BP 140/78 | HR 84 | Ht 70.0 in | Wt 234.0 lb

## 2011-11-27 DIAGNOSIS — I5032 Chronic diastolic (congestive) heart failure: Secondary | ICD-10-CM

## 2011-11-27 DIAGNOSIS — R55 Syncope and collapse: Secondary | ICD-10-CM

## 2011-11-27 DIAGNOSIS — I509 Heart failure, unspecified: Secondary | ICD-10-CM

## 2011-11-27 DIAGNOSIS — I2510 Atherosclerotic heart disease of native coronary artery without angina pectoris: Secondary | ICD-10-CM

## 2011-11-27 MED ORDER — METOPROLOL TARTRATE 25 MG PO TABS: 25.0000 mg | ORAL_TABLET | Freq: Two times a day (BID) | ORAL | Status: DC

## 2011-11-27 MED ORDER — AMLODIPINE BESYLATE 10 MG PO TABS: 10.0000 mg | ORAL_TABLET | Freq: Every day | ORAL | Status: DC

## 2011-11-27 MED ORDER — CHLORTHALIDONE 25 MG PO TABS: 25.0000 mg | ORAL_TABLET | ORAL | Status: DC | PRN

## 2011-11-27 NOTE — Assessment & Plan Note (Signed)
Status post pacemaker. No clinical recurrent syncope.

## 2011-11-27 NOTE — Assessment & Plan Note (Signed)
The patient has lower extremity edema but this could be secondary to amlodipine. I did give him a prescription for chlorthalidone 25 mg to use only for the next 3 days. Thereafter he can use it when necessary as needed for edema. He has a history of chronic renal sufficiency and I don't want to place the patient on daily Lasix. I discussed this with his daughter and she is in agreement.

## 2011-11-27 NOTE — Progress Notes (Signed)
Bradley Bottoms, MD, [facility] Board Certified in Adult Cardiovascular Medicine,Internal Medicine and Critical Care Medicine    CC: Followup patient with coronary artery disease reporting chest pain  HPI:  The patient is a 77 year old male with history of coronary artery disease, status post coronary bypass grafting, hypertension, dyslipidemia and prior pacemaker placement. He also has history of diastolic heart failure. He had a catheterization February 2011 and the details are described in prior notes. He had a LIMA to the diagonal and LAD without was patent and the patient was given for him recommendation for medical therapy. Ejection fraction was stable at 55-60%. Medical therapy was optimized and his angina has improved. Since he was seen last in his office he had a single episode of angina which required 2 nitroglycerin. He does have some shortness of breath on exertion and walks with a cane. However his symptoms have remained stable. The patient continues to live by himself. Of note is that post catheterization Cardiolite was done which showed moderate inferior ischemia. During the last office visit we placed him on amlodipine as well as increase isosorbide mononitrate with apparent good clinical response and as outlined above with only a single episode of angina. He denies any orthopnea PND palpitations or syncope. He has noticed some lower extremity edema and is not clear whether this is due to increased volume or secondary to his amlodipine     PMH: reviewed and listed in Problem List in Electronic Records (and see below) Past Medical History  Diagnosis Date  . Hyperlipidemia   . Hypertension   . Vertigo   . Osteoarthritis   . Prostate cancer   . Symptomatic bradycardia    Past Surgical History  Procedure Date  . Coronary artery bypass graft   . Cardiac catheterization February 2011    . Cholecystectomy   . Prostatectomy   . Ppm 6/11      Allergies/SH/FHX : available  in Electronic Records for review  Medications: Current Outpatient Prescriptions  Medication Sig Dispense Refill  . amLODipine (NORVASC) 10 MG tablet Take 1 tablet (10 mg total) by mouth daily.  90 tablet  3  . aspirin (ASPIR-LOW) 81 MG EC tablet Take 81 mg by mouth daily.        . calcium carbonate (TUMS) 500 MG chewable tablet Chew 2 tablets by mouth daily.        . clopidogrel (PLAVIX) 75 MG tablet Take 75 mg by mouth daily.        Marland Kitchen docusate sodium (COLACE) 100 MG capsule Take 100 mg by mouth daily.        . isosorbide mononitrate (IMDUR) 60 MG 24 hr tablet Take 1 1/2 tablet by mouth once daily.  135 tablet  3  . LORazepam (ATIVAN) 0.5 MG tablet Take 0.5 mg by mouth daily.        . metoprolol tartrate (LOPRESSOR) 25 MG tablet Take 1 tablet (25 mg total) by mouth 2 (two) times daily.  180 tablet  3  . nitroGLYCERIN (NITROSTAT) 0.4 MG SL tablet Place 0.4 mg under the tongue every 5 (five) minutes as needed.        . pantoprazole (PROTONIX) 40 MG tablet Take 40 mg by mouth daily.        . simvastatin (ZOCOR) 40 MG tablet Take 40 mg by mouth daily.        . chlorthalidone (HYGROTON) 25 MG tablet Take 1 tablet (25 mg total) by mouth as needed.  30 tablet  0    ROS: No nausea or vomiting. No fever or chills.No melena or hematochezia.No bleeding.No claudication. Lower extremity edema  Physical Exam: BP 140/78  Pulse 84  Ht 5\' 10"  (1.778 m)  Wt 234 lb (106.142 kg)  BMI 33.58 kg/m2 General: Well-nourished white male in no apparent distress. Neck: Normal carotid upstroke and no carotid bruits no thyromegaly nonnodular thyroid. JVP is 6 cm. Lungs: Clear breath sounds bilaterally with no wheezing Cardiac: Regular rate and rhythm with normal S1-S2 no murmur rubs or gallop Vascular: 2+ pitting edema. Normal dorsalis pedis and posterior tibial pulses Skin: Warm and dry  12lead ECG: Not obtained Limited bedside ECHO:N/A   Assessment and Plan

## 2011-11-27 NOTE — Patient Instructions (Signed)
   Increase Metoprolol to 25mg  twice a day   Increase Amlodipine to 10mg  daily  Chlorthalidone 25mg  daily x 3 days only, then as needed for edema Your physician wants you to follow up in: 6 months.  You will receive a reminder letter in the mail one-two months in advance.  If you don't receive a letter, please call our office to schedule the follow up appointment

## 2011-11-27 NOTE — Assessment & Plan Note (Signed)
The patient has stable angina with a single episode of chest pain since his last office visit. We will further increase his amlodipine to 10 mg by mouth daily and also increased his metoprolol to 25 minutes by mouth twice a day. His blood pressure and heart rate allow [place] to do this. Hopefully this will resolve his angina altogether.

## 2011-12-18 DIAGNOSIS — M25569 Pain in unspecified knee: Secondary | ICD-10-CM | POA: Diagnosis not present

## 2011-12-18 DIAGNOSIS — M1710 Unilateral primary osteoarthritis, unspecified knee: Secondary | ICD-10-CM | POA: Diagnosis not present

## 2011-12-20 ENCOUNTER — Encounter: Payer: Self-pay | Admitting: Internal Medicine

## 2011-12-20 ENCOUNTER — Ambulatory Visit (INDEPENDENT_AMBULATORY_CARE_PROVIDER_SITE_OTHER): Payer: Medicare Other | Admitting: Internal Medicine

## 2011-12-20 DIAGNOSIS — I10 Essential (primary) hypertension: Secondary | ICD-10-CM | POA: Diagnosis not present

## 2011-12-20 DIAGNOSIS — I498 Other specified cardiac arrhythmias: Secondary | ICD-10-CM | POA: Diagnosis not present

## 2011-12-20 DIAGNOSIS — R001 Bradycardia, unspecified: Secondary | ICD-10-CM

## 2011-12-20 LAB — PACEMAKER DEVICE OBSERVATION
AL AMPLITUDE: 1.4 mv
BAMS-0001: 175 {beats}/min
BATTERY VOLTAGE: 2.8 V
RV LEAD AMPLITUDE: 22.4 mv
RV LEAD THRESHOLD: 0.75 V

## 2011-12-20 NOTE — Assessment & Plan Note (Signed)
Above goal today He reports good BP control at home.  I will therefore make no changes today, though he needs to follow his BP closely and contact our office if elevated.

## 2011-12-20 NOTE — Assessment & Plan Note (Signed)
Normal pacemaker function See Pace Art report No changes today  

## 2011-12-20 NOTE — Progress Notes (Signed)
PCP:  Criss Rosales, MD Primary Cardiologist:  DeGent  The patient presents today for routine electrophysiology followup.  Since last being seen in our clinic, the patient reports doing very well.  He remains quite active despite his age.  Today, he denies symptoms of palpitations, chest pain, shortness of breath, dizziness, presyncope, syncope, or neurologic sequela.  His edema is improved. The patient feels that he is tolerating medications without difficulties and is otherwise without complaint today.   Past Medical History  Diagnosis Date  . Hyperlipidemia   . Hypertension   . Vertigo   . Osteoarthritis   . Prostate cancer   . Symptomatic bradycardia     s/p PPM   Past Surgical History  Procedure Date  . Coronary artery bypass graft   . Cardiac catheterization   . Cholecystectomy   . Prostatectomy   . Ppm 6/11    Current Outpatient Prescriptions  Medication Sig Dispense Refill  . amLODipine (NORVASC) 10 MG tablet Take 1 tablet (10 mg total) by mouth daily.  90 tablet  3  . aspirin (ASPIR-LOW) 81 MG EC tablet Take 81 mg by mouth daily.        . calcium carbonate (TUMS) 500 MG chewable tablet Chew 2 tablets by mouth daily.        . chlorthalidone (HYGROTON) 25 MG tablet Take 1 tablet (25 mg total) by mouth as needed.  30 tablet  0  . clopidogrel (PLAVIX) 75 MG tablet Take 75 mg by mouth daily.        Marland Kitchen docusate sodium (COLACE) 100 MG capsule Take 100 mg by mouth daily.        . isosorbide mononitrate (IMDUR) 60 MG 24 hr tablet Take 1 1/2 tablet by mouth once daily.  135 tablet  3  . LORazepam (ATIVAN) 0.5 MG tablet Take 0.5 mg by mouth daily.        . metoprolol tartrate (LOPRESSOR) 25 MG tablet Take 1 tablet (25 mg total) by mouth 2 (two) times daily.  180 tablet  3  . nitroGLYCERIN (NITROSTAT) 0.4 MG SL tablet Place 0.4 mg under the tongue every 5 (five) minutes as needed.        . pantoprazole (PROTONIX) 40 MG tablet Take 40 mg by mouth daily.        . simvastatin (ZOCOR)  40 MG tablet Take 40 mg by mouth daily.          Allergies  Allergen Reactions  . Codeine     History   Social History  . Marital Status: Married    Spouse Name: N/A    Number of Children: N/A  . Years of Education: N/A   Occupational History  . Not on file.   Social History Main Topics  . Smoking status: Never Smoker   . Smokeless tobacco: Never Used  . Alcohol Use: No  . Drug Use: No  . Sexually Active: Not on file   Other Topics Concern  . Not on file   Social History Narrative   Retired and married.     Family History  Problem Relation Age of Onset  . Coronary artery disease Mother   . Coronary artery disease Father   . Coronary artery disease Other    Physical Exam: Filed Vitals:   12/20/11 1052  BP: 150/76  Pulse: 85  Height: 5\' 10"  (1.778 m)  Weight: 238 lb (107.956 kg)  SpO2: 98%    GEN- The patient is well appearing, alert and oriented  x 3 today.   Head- normocephalic, atraumatic Eyes-  Sclera clear, conjunctiva pink Ears- hearing intact Oropharynx- clear Neck- supple, no JVP Lymph- no cervical lymphadenopathy Lungs- Clear to ausculation bilaterally, normal work of breathing Chest- pacemaker pocket is well healed Heart- Regular rate and rhythm, no murmurs, rubs or gallops, PMI not laterally displaced GI- soft, NT, ND, + BS Extremities- no clubbing, cyanosis, or edema Neuro- strength and sensation are intact  Pacemaker interrogation- reviewed in detail today,  See PACEART report  Assessment and Plan:

## 2012-01-29 DIAGNOSIS — R21 Rash and other nonspecific skin eruption: Secondary | ICD-10-CM | POA: Diagnosis not present

## 2012-01-29 DIAGNOSIS — Z950 Presence of cardiac pacemaker: Secondary | ICD-10-CM | POA: Diagnosis not present

## 2012-01-29 DIAGNOSIS — Z951 Presence of aortocoronary bypass graft: Secondary | ICD-10-CM | POA: Diagnosis not present

## 2012-01-29 DIAGNOSIS — Z7902 Long term (current) use of antithrombotics/antiplatelets: Secondary | ICD-10-CM | POA: Diagnosis not present

## 2012-01-29 DIAGNOSIS — Z79899 Other long term (current) drug therapy: Secondary | ICD-10-CM | POA: Diagnosis not present

## 2012-01-29 DIAGNOSIS — Z7982 Long term (current) use of aspirin: Secondary | ICD-10-CM | POA: Diagnosis not present

## 2012-01-29 DIAGNOSIS — I2510 Atherosclerotic heart disease of native coronary artery without angina pectoris: Secondary | ICD-10-CM | POA: Diagnosis not present

## 2012-01-29 DIAGNOSIS — I872 Venous insufficiency (chronic) (peripheral): Secondary | ICD-10-CM | POA: Diagnosis not present

## 2012-01-31 DIAGNOSIS — M79609 Pain in unspecified limb: Secondary | ICD-10-CM | POA: Diagnosis not present

## 2012-01-31 DIAGNOSIS — M7989 Other specified soft tissue disorders: Secondary | ICD-10-CM | POA: Diagnosis not present

## 2012-01-31 DIAGNOSIS — L97909 Non-pressure chronic ulcer of unspecified part of unspecified lower leg with unspecified severity: Secondary | ICD-10-CM | POA: Diagnosis not present

## 2012-01-31 DIAGNOSIS — E669 Obesity, unspecified: Secondary | ICD-10-CM | POA: Diagnosis not present

## 2012-01-31 DIAGNOSIS — I10 Essential (primary) hypertension: Secondary | ICD-10-CM | POA: Diagnosis not present

## 2012-01-31 DIAGNOSIS — L97809 Non-pressure chronic ulcer of other part of unspecified lower leg with unspecified severity: Secondary | ICD-10-CM | POA: Diagnosis not present

## 2012-02-05 DIAGNOSIS — Z79899 Other long term (current) drug therapy: Secondary | ICD-10-CM | POA: Diagnosis not present

## 2012-02-05 DIAGNOSIS — Z7982 Long term (current) use of aspirin: Secondary | ICD-10-CM | POA: Diagnosis not present

## 2012-02-05 DIAGNOSIS — Z7902 Long term (current) use of antithrombotics/antiplatelets: Secondary | ICD-10-CM | POA: Diagnosis not present

## 2012-02-05 DIAGNOSIS — I872 Venous insufficiency (chronic) (peripheral): Secondary | ICD-10-CM | POA: Diagnosis not present

## 2012-02-05 DIAGNOSIS — R609 Edema, unspecified: Secondary | ICD-10-CM | POA: Diagnosis not present

## 2012-02-05 DIAGNOSIS — Z951 Presence of aortocoronary bypass graft: Secondary | ICD-10-CM | POA: Diagnosis not present

## 2012-02-05 DIAGNOSIS — Z950 Presence of cardiac pacemaker: Secondary | ICD-10-CM | POA: Diagnosis not present

## 2012-02-05 DIAGNOSIS — R21 Rash and other nonspecific skin eruption: Secondary | ICD-10-CM | POA: Diagnosis not present

## 2012-02-05 DIAGNOSIS — I2510 Atherosclerotic heart disease of native coronary artery without angina pectoris: Secondary | ICD-10-CM | POA: Diagnosis not present

## 2012-02-14 ENCOUNTER — Telehealth: Payer: Self-pay | Admitting: *Deleted

## 2012-02-14 DIAGNOSIS — I5032 Chronic diastolic (congestive) heart failure: Secondary | ICD-10-CM | POA: Diagnosis not present

## 2012-02-14 DIAGNOSIS — R079 Chest pain, unspecified: Secondary | ICD-10-CM

## 2012-02-14 DIAGNOSIS — K219 Gastro-esophageal reflux disease without esophagitis: Secondary | ICD-10-CM | POA: Diagnosis not present

## 2012-02-14 DIAGNOSIS — R5381 Other malaise: Secondary | ICD-10-CM | POA: Diagnosis not present

## 2012-02-14 DIAGNOSIS — Z7982 Long term (current) use of aspirin: Secondary | ICD-10-CM | POA: Diagnosis not present

## 2012-02-14 DIAGNOSIS — I2510 Atherosclerotic heart disease of native coronary artery without angina pectoris: Secondary | ICD-10-CM | POA: Diagnosis not present

## 2012-02-14 DIAGNOSIS — R0789 Other chest pain: Secondary | ICD-10-CM | POA: Diagnosis not present

## 2012-02-14 DIAGNOSIS — I517 Cardiomegaly: Secondary | ICD-10-CM | POA: Diagnosis not present

## 2012-02-14 DIAGNOSIS — Z8546 Personal history of malignant neoplasm of prostate: Secondary | ICD-10-CM | POA: Diagnosis not present

## 2012-02-14 DIAGNOSIS — E785 Hyperlipidemia, unspecified: Secondary | ICD-10-CM | POA: Diagnosis not present

## 2012-02-14 DIAGNOSIS — Z8249 Family history of ischemic heart disease and other diseases of the circulatory system: Secondary | ICD-10-CM | POA: Diagnosis not present

## 2012-02-14 DIAGNOSIS — I10 Essential (primary) hypertension: Secondary | ICD-10-CM | POA: Diagnosis not present

## 2012-02-14 DIAGNOSIS — J9819 Other pulmonary collapse: Secondary | ICD-10-CM | POA: Diagnosis not present

## 2012-02-14 DIAGNOSIS — N289 Disorder of kidney and ureter, unspecified: Secondary | ICD-10-CM | POA: Diagnosis not present

## 2012-02-14 DIAGNOSIS — I059 Rheumatic mitral valve disease, unspecified: Secondary | ICD-10-CM | POA: Diagnosis not present

## 2012-02-14 DIAGNOSIS — Z951 Presence of aortocoronary bypass graft: Secondary | ICD-10-CM | POA: Diagnosis not present

## 2012-02-14 DIAGNOSIS — I200 Unstable angina: Secondary | ICD-10-CM | POA: Diagnosis not present

## 2012-02-14 DIAGNOSIS — Z7902 Long term (current) use of antithrombotics/antiplatelets: Secondary | ICD-10-CM | POA: Diagnosis not present

## 2012-02-14 DIAGNOSIS — Z79899 Other long term (current) drug therapy: Secondary | ICD-10-CM | POA: Diagnosis not present

## 2012-02-14 DIAGNOSIS — Z950 Presence of cardiac pacemaker: Secondary | ICD-10-CM | POA: Diagnosis not present

## 2012-02-14 NOTE — Telephone Encounter (Signed)
Daughter Webb Laws) concerned about father - c/o chest pain & SOB, mostly with exertion.  States he has had some weakness & dizziness today.  Chest pain has lasted off/on x 4 days.  Usually takes NTG x 1 & is relieved.  States he also has c/o jaw pain intermittently.   States she has tried to get him to go to go to ED, but he refused.  Scheduled OV with Gene Serpe, PA for 3/15.  Encouraged her take him to PMD at the least for evaluation.  Advised her that if symptoms worsen, he should go directly to ED.  She verbalized understanding.

## 2012-02-15 ENCOUNTER — Other Ambulatory Visit: Payer: Self-pay | Admitting: Physician Assistant

## 2012-02-15 ENCOUNTER — Ambulatory Visit (HOSPITAL_COMMUNITY)
Admission: AD | Admit: 2012-02-15 | Payer: Medicare Other | Source: Other Acute Inpatient Hospital | Admitting: Cardiovascular Disease

## 2012-02-15 ENCOUNTER — Encounter (HOSPITAL_COMMUNITY)
Admission: AD | Disposition: A | Discharge status: Home / Self Care | Payer: Self-pay | Source: Other Acute Inpatient Hospital | Attending: Cardiology

## 2012-02-15 ENCOUNTER — Inpatient Hospital Stay (HOSPITAL_COMMUNITY)
Admission: AD | Admit: 2012-02-15 | Discharge: 2012-02-17 | DRG: 287 | Disposition: A | Discharge status: Home / Self Care | Payer: Medicare Other | Source: Other Acute Inpatient Hospital | Attending: Cardiology | Admitting: Cardiology

## 2012-02-15 ENCOUNTER — Encounter (HOSPITAL_COMMUNITY): Payer: Self-pay | Admitting: General Practice

## 2012-02-15 DIAGNOSIS — N393 Stress incontinence (female) (male): Secondary | ICD-10-CM | POA: Insufficient documentation

## 2012-02-15 DIAGNOSIS — I2582 Chronic total occlusion of coronary artery: Secondary | ICD-10-CM | POA: Diagnosis present

## 2012-02-15 DIAGNOSIS — I739 Peripheral vascular disease, unspecified: Secondary | ICD-10-CM | POA: Insufficient documentation

## 2012-02-15 DIAGNOSIS — I5032 Chronic diastolic (congestive) heart failure: Secondary | ICD-10-CM

## 2012-02-15 DIAGNOSIS — I498 Other specified cardiac arrhythmias: Secondary | ICD-10-CM | POA: Diagnosis present

## 2012-02-15 DIAGNOSIS — I214 Non-ST elevation (NSTEMI) myocardial infarction: Secondary | ICD-10-CM

## 2012-02-15 DIAGNOSIS — I10 Essential (primary) hypertension: Secondary | ICD-10-CM | POA: Insufficient documentation

## 2012-02-15 DIAGNOSIS — I2510 Atherosclerotic heart disease of native coronary artery without angina pectoris: Secondary | ICD-10-CM | POA: Diagnosis not present

## 2012-02-15 DIAGNOSIS — I200 Unstable angina: Secondary | ICD-10-CM | POA: Diagnosis present

## 2012-02-15 DIAGNOSIS — I509 Heart failure, unspecified: Secondary | ICD-10-CM | POA: Diagnosis not present

## 2012-02-15 DIAGNOSIS — R001 Bradycardia, unspecified: Secondary | ICD-10-CM | POA: Insufficient documentation

## 2012-02-15 DIAGNOSIS — K219 Gastro-esophageal reflux disease without esophagitis: Secondary | ICD-10-CM | POA: Diagnosis not present

## 2012-02-15 DIAGNOSIS — R079 Chest pain, unspecified: Secondary | ICD-10-CM | POA: Diagnosis not present

## 2012-02-15 DIAGNOSIS — E785 Hyperlipidemia, unspecified: Secondary | ICD-10-CM | POA: Diagnosis not present

## 2012-02-15 DIAGNOSIS — I25810 Atherosclerosis of coronary artery bypass graft(s) without angina pectoris: Secondary | ICD-10-CM | POA: Diagnosis present

## 2012-02-15 HISTORY — DX: Gastro-esophageal reflux disease without esophagitis: K21.9

## 2012-02-15 HISTORY — PX: LEFT HEART CATHETERIZATION WITH CORONARY/GRAFT ANGIOGRAM: SHX5450

## 2012-02-15 LAB — CREATININE, SERUM
Creatinine, Ser: 1.08 mg/dL (ref 0.50–1.35)
GFR calc Af Amer: 69 mL/min — ABNORMAL LOW (ref >90)
GFR calc non Af Amer: 60 mL/min — ABNORMAL LOW (ref >90)

## 2012-02-15 LAB — CBC
HCT: 40.7 % (ref 39.0–52.0)
Hemoglobin: 13.7 g/dL (ref 13.0–17.0)
RDW: 13.5 % (ref 11.5–15.5)
WBC: 8.5 10*3/uL (ref 4.0–10.5)

## 2012-02-15 LAB — GLUCOSE, CAPILLARY: Glucose-Capillary: 136 mg/dL — ABNORMAL HIGH (ref 70–99)

## 2012-02-15 SURGERY — LEFT HEART CATHETERIZATION WITH CORONARY/GRAFT ANGIOGRAM
Anesthesia: LOCAL

## 2012-02-15 MED ORDER — SODIUM CHLORIDE 0.9 % IV SOLN: INTRAVENOUS | Status: DC

## 2012-02-15 MED ORDER — MIDAZOLAM HCL 2 MG/2ML IJ SOLN
INTRAMUSCULAR | Status: AC
  Filled 2012-02-15: qty 2

## 2012-02-15 MED ORDER — ASPIRIN 81 MG PO TBEC: 81.0000 mg | DELAYED_RELEASE_TABLET | Freq: Every day | ORAL | Status: DC

## 2012-02-15 MED ORDER — PANTOPRAZOLE SODIUM 40 MG PO TBEC
40.0000 mg | DELAYED_RELEASE_TABLET | Freq: Every day | ORAL | Status: DC
  Administered 2012-02-16 – 2012-02-17 (×2): 40 mg via ORAL
  Filled 2012-02-15 (×4): qty 1

## 2012-02-15 MED ORDER — SIMVASTATIN 40 MG PO TABS: 40.0000 mg | ORAL_TABLET | Freq: Every day | ORAL | Status: DC

## 2012-02-15 MED ORDER — FENTANYL CITRATE 0.05 MG/ML IJ SOLN
INTRAMUSCULAR | Status: AC
  Filled 2012-02-15: qty 2

## 2012-02-15 MED ORDER — NITROGLYCERIN 0.2 MG/ML ON CALL CATH LAB
INTRAVENOUS | Status: AC
  Filled 2012-02-15: qty 1

## 2012-02-15 MED ORDER — ATORVASTATIN CALCIUM 40 MG PO TABS
40.0000 mg | ORAL_TABLET | Freq: Every day | ORAL | Status: DC
  Administered 2012-02-15 – 2012-02-16 (×2): 40 mg via ORAL
  Filled 2012-02-15 (×3): qty 1

## 2012-02-15 MED ORDER — HEPARIN (PORCINE) IN NACL 2-0.9 UNIT/ML-% IJ SOLN
INTRAMUSCULAR | Status: AC
  Filled 2012-02-15: qty 2000

## 2012-02-15 MED ORDER — SODIUM CHLORIDE 0.9 % IJ SOLN: 3.0000 mL | Freq: Two times a day (BID) | INTRAMUSCULAR | Status: DC

## 2012-02-15 MED ORDER — NITROGLYCERIN 0.4 MG SL SUBL: 0.4000 mg | SUBLINGUAL_TABLET | SUBLINGUAL | Status: DC | PRN

## 2012-02-15 MED ORDER — CALCIUM CARBONATE ANTACID 500 MG PO CHEW
2.0000 | CHEWABLE_TABLET | Freq: Every day | ORAL | Status: DC
  Administered 2012-02-17: 400 mg via ORAL
  Filled 2012-02-15 (×3): qty 2

## 2012-02-15 MED ORDER — DIAZEPAM 5 MG PO TABS
5.0000 mg | ORAL_TABLET | ORAL | Status: AC
  Administered 2012-02-15: 5 mg via ORAL
  Filled 2012-02-15: qty 1

## 2012-02-15 MED ORDER — METOPROLOL TARTRATE 12.5 MG HALF TABLET: 12.5000 mg | ORAL_TABLET | Freq: Two times a day (BID) | ORAL | Status: DC

## 2012-02-15 MED ORDER — SODIUM CHLORIDE 0.9 % IV SOLN: 250.0000 mL | INTRAVENOUS | Status: DC | PRN

## 2012-02-15 MED ORDER — ONDANSETRON HCL 4 MG/2ML IJ SOLN: 4.0000 mg | Freq: Four times a day (QID) | INTRAMUSCULAR | Status: DC | PRN

## 2012-02-15 MED ORDER — AMLODIPINE BESYLATE 10 MG PO TABS
10.0000 mg | ORAL_TABLET | Freq: Every day | ORAL | Status: DC
  Administered 2012-02-15 – 2012-02-17 (×3): 10 mg via ORAL
  Filled 2012-02-15 (×3): qty 1

## 2012-02-15 MED ORDER — GUAIFENESIN-DM 100-10 MG/5ML PO SYRP: 15.0000 mL | ORAL_SOLUTION | ORAL | Status: DC | PRN

## 2012-02-15 MED ORDER — CLOPIDOGREL BISULFATE 75 MG PO TABS: 75.0000 mg | ORAL_TABLET | Freq: Every day | ORAL | Status: DC

## 2012-02-15 MED ORDER — ISOSORBIDE MONONITRATE ER 30 MG PO TB24
30.0000 mg | ORAL_TABLET | Freq: Every day | ORAL | Status: DC
  Filled 2012-02-15: qty 1

## 2012-02-15 MED ORDER — SODIUM CHLORIDE 0.9 % IV SOLN: INTRAVENOUS | Status: AC

## 2012-02-15 MED ORDER — LORAZEPAM 1 MG PO TABS
0.5000 mg | ORAL_TABLET | Freq: Every day | ORAL | Status: DC
  Administered 2012-02-15 – 2012-02-16 (×2): 0.5 mg via ORAL
  Filled 2012-02-15 (×2): qty 1

## 2012-02-15 MED ORDER — MAGNESIUM HYDROXIDE 400 MG/5ML PO SUSP: 30.0000 mL | Freq: Every day | ORAL | Status: DC | PRN

## 2012-02-15 MED ORDER — CLOPIDOGREL BISULFATE 75 MG PO TABS
75.0000 mg | ORAL_TABLET | Freq: Every day | ORAL | Status: DC
  Administered 2012-02-16 – 2012-02-17 (×2): 75 mg via ORAL
  Filled 2012-02-15: qty 1

## 2012-02-15 MED ORDER — HEPARIN (PORCINE) IN NACL 100-0.45 UNIT/ML-% IJ SOLN
1040.0000 [IU]/h | INTRAMUSCULAR | Status: DC
  Filled 2012-02-15: qty 250

## 2012-02-15 MED ORDER — NITROGLYCERIN 2 % TD OINT: 0.5000 [in_us] | TOPICAL_OINTMENT | Freq: Four times a day (QID) | TRANSDERMAL | Status: DC

## 2012-02-15 MED ORDER — AMLODIPINE BESYLATE 5 MG PO TABS: 5.0000 mg | ORAL_TABLET | Freq: Every day | ORAL | Status: DC

## 2012-02-15 MED ORDER — SODIUM CHLORIDE 0.9 % IJ SOLN: 3.0000 mL | INTRAMUSCULAR | Status: DC | PRN

## 2012-02-15 MED ORDER — LIDOCAINE HCL (PF) 1 % IJ SOLN
INTRAMUSCULAR | Status: AC
  Filled 2012-02-15: qty 30

## 2012-02-15 MED ORDER — ASPIRIN 81 MG PO CHEW: 324.0000 mg | CHEWABLE_TABLET | ORAL | Status: DC

## 2012-02-15 MED ORDER — DOCUSATE SODIUM 100 MG PO CAPS
200.0000 mg | ORAL_CAPSULE | Freq: Every day | ORAL | Status: DC
  Administered 2012-02-16 – 2012-02-17 (×2): 200 mg via ORAL
  Filled 2012-02-15 (×3): qty 2

## 2012-02-15 MED ORDER — ASPIRIN EC 81 MG PO TBEC
81.0000 mg | DELAYED_RELEASE_TABLET | Freq: Every day | ORAL | Status: DC
  Administered 2012-02-16 – 2012-02-17 (×2): 81 mg via ORAL
  Filled 2012-02-15 (×2): qty 1

## 2012-02-15 MED ORDER — METOPROLOL TARTRATE 25 MG PO TABS
25.0000 mg | ORAL_TABLET | Freq: Two times a day (BID) | ORAL | Status: DC
  Administered 2012-02-15 – 2012-02-17 (×5): 25 mg via ORAL
  Filled 2012-02-15 (×6): qty 1

## 2012-02-15 MED ORDER — ACETAMINOPHEN 325 MG PO TABS: 650.0000 mg | ORAL_TABLET | ORAL | Status: DC | PRN

## 2012-02-15 MED ORDER — ACETAMINOPHEN 325 MG PO TABS
650.0000 mg | ORAL_TABLET | ORAL | Status: DC | PRN
  Administered 2012-02-15 – 2012-02-16 (×2): 650 mg via ORAL
  Filled 2012-02-15 (×2): qty 2

## 2012-02-15 MED ORDER — HEPARIN SODIUM (PORCINE) 5000 UNIT/ML IJ SOLN
5000.0000 [IU] | Freq: Three times a day (TID) | INTRAMUSCULAR | Status: DC
  Administered 2012-02-15 – 2012-02-17 (×4): 5000 [IU] via SUBCUTANEOUS
  Filled 2012-02-15 (×2): qty 1

## 2012-02-15 MED ORDER — ALUM & MAG HYDROXIDE-SIMETH 200-200-20 MG/5ML PO SUSP: 15.0000 mL | ORAL | Status: DC | PRN

## 2012-02-15 NOTE — Progress Notes (Signed)
78 y/o male with h/o CAD s/p CABG. Last cath feb 2011. Severe native CAD with Patent LIMA-LAD. SVG-LCX totalled. SVG-PDA with high grade lesion at insertion and in native PDA. Treated medically.   Transferred from [facility] today for cath due to CP.   Chart reviewed. Patient seen and examined. No change from recent H&P. Cardiac markers negative.   Full H&P will be scanned in at d/c.   Jonluke Cobbins,MD 3:50 PM   

## 2012-02-15 NOTE — Op Note (Addendum)
Cardiac Cath Procedure Note  Indication:   Procedures performed:  1) Selective coronary angiography 2) SVG angiography 3) LIMA angiography 4) Left heart catheterization 5) Left ventriculogram  Description of procedure:     The risks and indication of the procedure were explained. Consent was signed and placed on the chart. An appropriate timeout was taken prior to the procedure. The right groin was prepped and draped in the routine sterile fashion and anesthetized with 1% local lidocaine.   A 5 FR arterial sheath was placed in the right femoral artery using a modified Seldinger technique. Standard catheters including a JL4, JR4, IM and angled pigtail were used. All catheter exchanges were made over a wire.   Complications:  None apparent  Findings:   Left main: Distal 50-60% stenosis   LAD: Chronically occlude ostially  LCX: Small. Diffuse moderate disease. OM-1 probably 50-60 ostial stenosis. OM-2 chronically occluded. Fine collaterals to distal RCA  RCA: Chronically occluded proximally.   LV-gram done in the RAO projection: Ejection fraction = 55% Mild anterior HK  SVG - OM-1 - OM-2: not injected. Previously shown to be occluded ostially.   SVG - R acute marginal - PDA - PL:  Body of vein graft is normal. Insertion at AM looks good. Insertion to PDA is intact but PDA is totally occluded after insertion (chronic). Insertion to PL has 95% stenosis in graft. Native PL has diffuse 90% lesion throughout midsection spanning the graft insertion. This is essentially unnchaged from February 2011.  LIMA to D1 & LAD: Widely patent. Moderate diffuse CAD in mid to distal LAD. L-> R collaterals from LAD to distal R  Assessment:  1. Severe native vessel CAD 2. Significant graft disease as described above without change from Februrary 2011 3, LVEF 55%  Plan/Discussion:  He has multiple areas for potential ischemia but no good candidates for PCI. . I have reviewed films with Dr.  Clifton James with particular attention to the SVG to RPL lesion. We both feel that this area is unchanged from before and the risks of trying to fix it outweigh the possible benefits. We will continue medical therapy. Can increased Imdur as needed.  Have suggested cardiac rehab in [place] for chronic angina. Please make referral on d/c.   Arvilla Meres, MD 4:25 PM

## 2012-02-15 NOTE — Progress Notes (Signed)
ANTICOAGULATION CONSULT NOTE - Initial Consult  Pharmacy Consult for Heparin Indication: NSTEMI  Allergies  Allergen Reactions  . Codeine     Patient Measurements:   Heparin Dosing Weight: 98 kg  Vital Signs:    Labs: No results found for this basename: HGB:2,HCT:3,PLT:3,APTT:3,LABPROT:3,INR:3,HEPARINUNFRC:3,CREATININE:3,CKTOTAL:3,CKMB:3,TROPONINI:3 in the last 72 hours The CrCl is unknown because both a height and weight (above a minimum accepted value) are required for this calculation.  Medical History: Past Medical History  Diagnosis Date  . Hyperlipidemia   . Hypertension   . Vertigo   . Osteoarthritis   . Prostate cancer   . Symptomatic bradycardia     s/p PPM    Medications:  Prescriptions prior to admission  Medication Sig Dispense Refill  . amLODipine (NORVASC) 10 MG tablet Take 1 tablet (10 mg total) by mouth daily.  90 tablet  3  . aspirin (ASPIR-LOW) 81 MG EC tablet Take 81 mg by mouth daily.        . calcium carbonate (TUMS) 500 MG chewable tablet Chew 2 tablets by mouth daily.        . chlorthalidone (HYGROTON) 25 MG tablet Take 1 tablet (25 mg total) by mouth as needed.  30 tablet  0  . clopidogrel (PLAVIX) 75 MG tablet Take 75 mg by mouth daily.        Marland Kitchen docusate sodium (COLACE) 100 MG capsule Take 100 mg by mouth daily.        . isosorbide mononitrate (IMDUR) 60 MG 24 hr tablet Take 1 1/2 tablet by mouth once daily.  135 tablet  3  . LORazepam (ATIVAN) 0.5 MG tablet Take 0.5 mg by mouth daily.        . metoprolol tartrate (LOPRESSOR) 25 MG tablet Take 1 tablet (25 mg total) by mouth 2 (two) times daily.  180 tablet  3  . nitroGLYCERIN (NITROSTAT) 0.4 MG SL tablet Place 0.4 mg under the tongue every 5 (five) minutes as needed.        . pantoprazole (PROTONIX) 40 MG tablet Take 40 mg by mouth daily.        . simvastatin (ZOCOR) 40 MG tablet Take 40 mg by mouth daily.          Assessment: 89yom to continue heparin from [facility] for NSTEMI. Patient was  admitted to [facility] last PM and heparin was started. Now transferred to [facility] to have cardiac cath with possible PCI (scheduled ~ 1330). Per [facility] records, heparin level was supratherapeutic (1.22) this AM on 1280 units/hr. Heparin was held x 1hr and restarted @ 0740 with rate of 1040 units/hr. Heparin was continued at this rate during transfer and upon arrival.  - H/H and Plts wnl, Baseline INR 1 (labs from [facility]) - No significant bleeding reported  Goal of Therapy:  Heparin level 0.3-0.7 units/ml   Plan:  1. Continue heparin 1040 units/hr (10.4 ml/hr) 2. Since cath scheduled within 1 hour, will not check follow-up heparin level unless cath delayed (next level would be checked ~ 1600) 3. Follow-up post cath orders  Cleon Dew [phone] 02/15/2012,11:49 AM

## 2012-02-15 NOTE — Interval H&P Note (Signed)
History and Physical Interval Note:  02/15/2012 3:50 PM  Bradley Figueroa  has presented today for surgery, with the diagnosis of chest pain  The various methods of treatment have been discussed with the patient and family. After consideration of risks, benefits and other options for treatment, the patient has consented to  Procedure(s) (LRB): LEFT HEART CATHETERIZATION WITH CORONARY/GRAFT ANGIOGRAM (N/A) and POSSIBLE ANGIOPLASTY as a surgical intervention .  The patients' history has been reviewed, patient examined, no change in status, stable for surgery.  I have reviewed the patients' chart and labs.  Questions were answered to the patient's satisfaction.     Salimatou Simone

## 2012-02-15 NOTE — H&P (View-Only) (Signed)
79 y/o male with h/o CAD s/p CABG. Last cath feb 2011. Severe native CAD with Patent LIMA-LAD. SVG-LCX totalled. SVG-PDA with high grade lesion at insertion and in native PDA. Treated medically.   Transferred from [facility] today for cath due to CP.   Chart reviewed. Patient seen and examined. No change from recent H&P. Cardiac markers negative.   Full H&P will be scanned in at d/c.   Dewell Monnier,MD 3:50 PM

## 2012-02-16 DIAGNOSIS — I5032 Chronic diastolic (congestive) heart failure: Secondary | ICD-10-CM

## 2012-02-16 DIAGNOSIS — I509 Heart failure, unspecified: Secondary | ICD-10-CM | POA: Diagnosis not present

## 2012-02-16 DIAGNOSIS — I2510 Atherosclerotic heart disease of native coronary artery without angina pectoris: Secondary | ICD-10-CM | POA: Diagnosis not present

## 2012-02-16 LAB — BASIC METABOLIC PANEL
Chloride: 107 mEq/L (ref 96–112)
Creatinine, Ser: 1.18 mg/dL (ref 0.50–1.35)
GFR calc Af Amer: 62 mL/min — ABNORMAL LOW (ref >90)
GFR calc non Af Amer: 54 mL/min — ABNORMAL LOW (ref >90)

## 2012-02-16 LAB — CBC
HCT: 41.4 % (ref 39.0–52.0)
Platelets: 184 10*3/uL (ref 150–400)
RDW: 13.5 % (ref 11.5–15.5)
WBC: 8.8 10*3/uL (ref 4.0–10.5)

## 2012-02-16 LAB — POCT ACTIVATED CLOTTING TIME: Activated Clotting Time: 138 seconds

## 2012-02-16 MED ORDER — ISOSORBIDE MONONITRATE ER 60 MG PO TB24
60.0000 mg | ORAL_TABLET | Freq: Two times a day (BID) | ORAL | Status: DC
  Administered 2012-02-16 – 2012-02-17 (×3): 60 mg via ORAL
  Filled 2012-02-16 (×5): qty 1

## 2012-02-16 NOTE — Plan of Care (Signed)
Problem: Phase II Progression Outcomes Goal: Vascular site scale level 0 - I Vascular Site Scale Level 0: No bruising/bleeding/hematoma Level I (Mild): Bruising/Ecchymosis, minimal bleeding/ooozing, palpable hematoma < 3 cm Level II (Moderate): Bleeding not affecting hemodynamic parameters, pseudoaneurysm, palpable hematoma > 3 cm  Outcome: Adequate for Discharge Level 1 small hint of bruise

## 2012-02-16 NOTE — Progress Notes (Signed)
SUBJECTIVE: No jaw pain or chest pain this am. NO SOB.   BP 139/60  Pulse 63  Temp(Src) 97 F (36.1 C) (Oral)  Resp 18  Ht 5\' 11"  (1.803 m)  Wt 236 lb (107.049 kg)  BMI 32.92 kg/m2  SpO2 94%  Intake/Output Summary (Last 24 hours) at 02/16/12 0940 Last data filed at 02/15/12 2153  Gross per 24 hour  Intake      0 ml  Output     75 ml  Net    -75 ml    PHYSICAL EXAM General: Well developed, well nourished, in no acute distress. Alert and oriented x 3.  Psych:  Good affect, responds appropriately Neck: No JVD. No masses noted.  Lungs: Clear bilaterally with no wheezes or rhonci noted.  Heart: RRR with no murmurs noted. Abdomen: Bowel sounds are present. Soft, non-tender.  Extremities: No lower extremity edema.   LABS: Basic Metabolic Panel:  Basename 02/16/12 0550 02/15/12 1756  NA 142 --  K 5.1 --  CL 107 --  CO2 26 --  GLUCOSE 99 --  BUN 20 --  CREATININE 1.18 1.08  CALCIUM 9.8 --  MG -- --  PHOS -- --   CBC:  Basename 02/16/12 0550 02/15/12 1756  WBC 8.8 8.5  NEUTROABS -- --  HGB 14.1 13.7  HCT 41.4 40.7  MCV 91.0 91.5  PLT 184 180    Current Meds:    . amLODipine  10 mg Oral Daily  . aspirin EC  81 mg Oral Daily  . atorvastatin  40 mg Oral q1800  . calcium carbonate  2 tablet Oral Daily  . clopidogrel  75 mg Oral Q breakfast  . diazepam  5 mg Oral On Call  . docusate sodium  200 mg Oral Daily  . fentaNYL      . heparin      . heparin  5,000 Units Subcutaneous Q8H  . isosorbide mononitrate  30 mg Oral Daily  . lidocaine      . LORazepam  0.5 mg Oral QHS  . metoprolol tartrate  25 mg Oral BID  . midazolam      . nitroGLYCERIN      . pantoprazole  40 mg Oral Daily  . DISCONTD: amLODipine  5 mg Oral Daily  . DISCONTD: aspirin  324 mg Oral Pre-Cath  . DISCONTD: aspirin  81 mg Oral Daily  . DISCONTD: clopidogrel  75 mg Oral Daily  . DISCONTD: clopidogrel  75 mg Oral Q breakfast  . DISCONTD: metoprolol tartrate  12.5 mg Oral BID  .  DISCONTD: nitroGLYCERIN  0.5 inch Topical Q6H  . DISCONTD: simvastatin  40 mg Oral Daily  . DISCONTD: sodium chloride  3 mL Intravenous Q12H   Cardiac cath 02/15/12:  Left main: Distal 50-60% stenosis  LAD: Chronically occlude ostially  LCX: Small. Diffuse moderate disease. OM-1 probably 50-60 ostial stenosis. OM-2 chronically occluded. Fine collaterals to distal RCA  RCA: Chronically occluded proximally.  LV-gram done in the RAO projection: Ejection fraction = 55% Mild anterior HK   SVG - OM-1 - OM-2: not injected. Previously shown to be occluded ostially.  SVG - R acute marginal - PDA - PL: Body of vein graft is normal. Insertion at AM looks good. Insertion to PDA is intact but PDA is totally occluded after insertion (chronic). Insertion to PL has 95% stenosis in graft. Native PL has diffuse 90% lesion throughout midsection spanning the graft insertion. This is essentially unnchaged from February 2011.  LIMA to D1 & LAD: Widely patent. Moderate diffuse CAD in mid to distal LAD. L-> R collaterals from LAD to distal R   ASSESSMENT AND PLAN:  1. CAD: Pt transferred from [facility] yesterday for cardiac cath. He has a history of CABG. Cath yesterday per Dr. Gala Romney. Pt found to have stable severe disease as above. Medical management has been recommended. Will continue ASA/Plavix/statin/beta blocker/Imdur. I have had a long conversation with the patient and his daughter this am. No good future options for PCI. Will increase Imdur to 60 mg po BID. Ambulate today. With medication change, will monitor today and plan for discharge in am tomorrow  2. HTN: Well controlled.   3. Chronic diastolic heart failure: Volume status is ok.   Bradley Figueroa  3/8/20139:40 AM

## 2012-02-17 ENCOUNTER — Encounter (HOSPITAL_COMMUNITY): Payer: Self-pay | Admitting: Nurse Practitioner

## 2012-02-17 DIAGNOSIS — I200 Unstable angina: Secondary | ICD-10-CM

## 2012-02-17 LAB — BASIC METABOLIC PANEL
BUN: 21 mg/dL (ref 6–23)
Calcium: 9 mg/dL (ref 8.4–10.5)
Creatinine, Ser: 1.26 mg/dL (ref 0.50–1.35)
GFR calc Af Amer: 57 mL/min — ABNORMAL LOW (ref >90)
GFR calc non Af Amer: 49 mL/min — ABNORMAL LOW (ref >90)

## 2012-02-17 LAB — CBC
MCHC: 34.1 g/dL (ref 30.0–36.0)
Platelets: 175 10*3/uL (ref 150–400)
RDW: 13.5 % (ref 11.5–15.5)
WBC: 7.9 10*3/uL (ref 4.0–10.5)

## 2012-02-17 MED ORDER — AMLODIPINE BESYLATE 10 MG PO TABS: 5.0000 mg | ORAL_TABLET | Freq: Every day | ORAL | Status: DC

## 2012-02-17 MED ORDER — AMLODIPINE BESYLATE 10 MG PO TABS: 10.0000 mg | ORAL_TABLET | Freq: Every day | ORAL | Status: DC

## 2012-02-17 MED ORDER — METOPROLOL TARTRATE 25 MG PO TABS: 25.0000 mg | ORAL_TABLET | Freq: Two times a day (BID) | ORAL | Status: DC

## 2012-02-17 MED ORDER — DOCUSATE SODIUM 100 MG PO CAPS: 200.0000 mg | ORAL_CAPSULE | Freq: Every day | ORAL | Status: DC

## 2012-02-17 MED ORDER — ISOSORBIDE MONONITRATE ER 60 MG PO TB24: 60.0000 mg | ORAL_TABLET | Freq: Two times a day (BID) | ORAL | Status: DC

## 2012-02-17 MED ORDER — CALCIUM CARBONATE ANTACID 500 MG PO CHEW: 2.0000 | CHEWABLE_TABLET | Freq: Every day | ORAL | Status: DC

## 2012-02-17 NOTE — Discharge Summary (Signed)
Patient ID: Bradley Figueroa,  MRN: [id number], DOB/AGE: 12-23-1921 78 y.o.  Admit date: 02/15/2012 Discharge date: 02/17/2012  Primary Care Provider: Criss Rosales, MD Primary Cardiologist: Reece Agar. Degent MD 26  Discharge Diagnoses Principal Problem:  *Unstable angina Active Problems:  HYPERTENSION  CAD  Bradycardia  Chronic diastolic heart failure  PVD  Stress incontinence, male   Allergies Allergies  Allergen Reactions  . Codeine Nausea And Vomiting    Procedures  02/15/2012 Cardiac Catheterization  Left main: Distal 50-60% stenosis LAD: Chronically occlude ostially LCX: Small. Diffuse moderate disease. OM-1 probably 50-60 ostial stenosis. OM-2 chronically occluded. Fine collaterals to distal RCA RCA: Chronically occluded proximally.  LV-gram done in the RAO projection: Ejection fraction = 55% Mild anterior HK SVG - OM-1 - OM-2: not injected. Previously shown to be occluded ostially.  SVG - R acute marginal - PDA - PL:  Body of vein graft is normal. Insertion at AM looks good. Insertion to PDA is intact but PDA is totally occluded after insertion (chronic). Insertion to PL has 95% stenosis in graft. Native PL has diffuse 90% lesion throughout midsection spanning the graft insertion. This is essentially unnchaged from February 2011. LIMA to D1 & LAD: Widely patent. Moderate diffuse CAD in mid to distal LAD. L-> R collaterals from LAD to distal RCA  Assessment:  1. Severe native vessel CAD 2. Significant graft disease as described above without change from Februrary 2011 3, LVEF 55% _________________________  History of Present Illness  78 year old male with the above problem list who was recently admitted to [facility] hospital secondary to recurrent chest pain. He was transferred to [facility] on March 7 for further evaluation.  Hospital Course  Following admission, patient had no further chest pain. He was taken to the cardiac catheterization laboratory on March 7 and  underwent diagnostic catheterization revealing severe three-vessel coronary artery disease with a patent LIMA to the LAD and first diagonal & patent vein graft to the acute marginal. That graft continues to the PDA however the PDA is occluded. That same graft also continues to the posterolateral branch and at that insertion there was a 95% stenosis. The posterolateral was diffusely diseased throughout. The vein graft to the first and second obtuse marginals was noted to be occluded. Films were reviewed and in comparison to the February 2011 catheterization there were no significant changes to anatomy overall and therefore continued medical therapy was recommended. Patient's home dose of isosorbide mononitrate, Toprol, and amlodipine were titrated. He's had no further chest pain has been doing without difficulty. He'll be discharged home today in good condition.  Discharge Vitals Blood pressure 147/80, pulse 42, temperature 97.8 F (36.6 C), temperature source Oral, resp. rate 18, height 5\' 11"  (1.803 m), weight 236 lb (107.049 kg), SpO2 96.00%.  Filed Weights   02/15/12 1203  Weight: 236 lb (107.049 kg)    Labs  CBC  Basename 02/17/12 0530 02/16/12 0550  WBC 7.9 8.8  NEUTROABS -- --  HGB 12.5* 14.1  HCT 36.7* 41.4  MCV 91.3 91.0  PLT 175 184   Basic Metabolic Panel  Basename 02/17/12 0530 02/16/12 0550  NA 140 142  K 4.2 5.1  CL 106 107  CO2 26 26  GLUCOSE 104* 99  BUN 21 20  CREATININE 1.26 1.18  CALCIUM 9.0 9.8  MG -- --  PHOS -- --   Disposition  Pt is being discharged home today in good condition.  Follow-up Plans & Appointments  Follow-up Information    Follow up  with Peyton Bottoms, MD in 2 weeks. (we will arrange, call to retrieve)    Contact information:   [address] [place] [place] [address] [phone]          Discharge Medications  Medication List  As of 02/17/2012  2:57 PM   TAKE these medications         amLODipine 10 MG tablet    Commonly known as: NORVASC   Take 1 tablet (10 mg total) by mouth daily.      ASPIR-LOW 81 MG EC tablet   Generic drug: aspirin   Take 81 mg by mouth daily.      ATIVAN 0.5 MG tablet   Generic drug: LORazepam   Take 0.5 mg by mouth at bedtime.      COLACE 100 MG capsule   Generic drug: docusate sodium   Take 200 mg by mouth daily.      isosorbide mononitrate 60 MG 24 hr tablet   Commonly known as: IMDUR   Take 1 tablet (60 mg total) by mouth 2 (two) times daily.      metoprolol tartrate 25 MG tablet   Commonly known as: LOPRESSOR   Take 1 tablet (25 mg total) by mouth 2 (two) times daily.      NITROSTAT 0.4 MG SL tablet   Generic drug: nitroGLYCERIN   Place 0.4 mg under the tongue every 5 (five) minutes as needed. For chest pain      PLAVIX 75 MG tablet   Generic drug: clopidogrel   Take 75 mg by mouth daily.      PROTONIX 40 MG tablet   Generic drug: pantoprazole   Take 40 mg by mouth daily.      simvastatin 40 MG tablet   Commonly known as: ZOCOR   Take 40 mg by mouth daily.      TUMS 500 MG chewable tablet   Generic drug: calcium carbonate   Chew 2 tablets by mouth daily.      VOLTAREN 1 % Gel   Generic drug: diclofenac sodium   Apply 1 application topically as needed. For arthritis            Outstanding Labs/Studies  None  Duration of Discharge Encounter   Greater than 30 minutes including physician time.  Signed, Nicolasa Ducking NP 02/17/2012, 2:57 PM

## 2012-02-17 NOTE — Progress Notes (Signed)
Patient ID: Bradley Figueroa, male   DOB: 07-21-1922, 78 y.o.   MRN: [id number] Subjective:  No chest pain or sob.  Objective:  Vital Signs in the last 24 hours: Temp:  [97.3 F (36.3 C)-97.8 F (36.6 C)] 97.8 F (36.6 C) (03/09 0600) Pulse Rate:  [42-64] 42  (03/09 0600) Resp:  [18-20] 18  (03/09 0600) BP: (117-147)/(53-80) 147/80 mmHg (03/09 1013) SpO2:  [95 %-96 %] 96 % (03/09 0600)  Intake/Output from previous day: 03/08 0701 - 03/09 0700 In: 360 [P.O.:360] Out: -  Intake/Output from this shift:    Physical Exam: Well appearing elderly man, NAD HEENT: Unremarkable Neck:  No JVD, no thyromegally Lymphatics:  No adenopathy Back:  No CVA tenderness Lungs:  Clear with no wheezes, rales, or rhonchi HEART:  Regular rate rhythm, no murmurs, no rubs, no clicks Abd:  Flat, positive bowel sounds, no organomegally, no rebound, no guarding Ext:  2 plus pulses, no edema, no cyanosis, no clubbing Skin:  No rashes no nodules Neuro:  CN II through XII intact, motor grossly intact  Lab Results:  Basename 02/17/12 0530 02/16/12 0550  WBC 7.9 8.8  HGB 12.5* 14.1  PLT 175 184    Basename 02/17/12 0530 02/16/12 0550  NA 140 142  K 4.2 5.1  CL 106 107  CO2 26 26  GLUCOSE 104* 99  BUN 21 20  CREATININE 1.26 1.18   No results found for this basename: TROPONINI:2,CK,MB:2 in the last 72 hours Hepatic Function Panel No results found for this basename: PROT,ALBUMIN,AST,ALT,ALKPHOS,BILITOT,BILIDIR,IBILI in the last 72 hours No results found for this basename: CHOL in the last 72 hours No results found for this basename: PROTIME in the last 72 hours  Imaging: No results found.  Cardiac Studies: Tele - NSR with atrial pacing. Assessment/Plan:  1. Severe 3 vessel CAD with severe graft disease - he is pain free. Note rec's of Dr. Caryl Ada. Continue medical therapy and use slNTG as needed. Could consider uptitration of beta blocker as an outpatient. 2. Disp. - discharge home with followup  with Dr. Andee Lineman.  LOS: 2 days    Lewayne Bunting 02/17/2012, 11:25 AM

## 2012-02-17 NOTE — Plan of Care (Signed)
Problem: Phase II Progression Outcomes Goal: Vascular site scale level 0 - I Vascular Site Scale Level 0: No bruising/bleeding/hematoma Level I (Mild): Bruising/Ecchymosis, minimal bleeding/ooozing, palpable hematoma < 3 cm Level II (Moderate): Bleeding not affecting hemodynamic parameters, pseudoaneurysm, palpable hematoma > 3 cm  Outcome: Completed/Met Date Met:  02/17/12 Right groin with tiny bruising level (0) no redness , drainage or edema

## 2012-02-17 NOTE — Discharge Instructions (Signed)

## 2012-02-23 ENCOUNTER — Ambulatory Visit (INDEPENDENT_AMBULATORY_CARE_PROVIDER_SITE_OTHER): Payer: Medicare Other | Admitting: Physician Assistant

## 2012-02-23 ENCOUNTER — Encounter: Payer: Self-pay | Admitting: Physician Assistant

## 2012-02-23 VITALS — BP 155/75 | HR 78 | Ht 70.0 in | Wt 237.0 lb

## 2012-02-23 DIAGNOSIS — I2510 Atherosclerotic heart disease of native coronary artery without angina pectoris: Secondary | ICD-10-CM | POA: Diagnosis not present

## 2012-02-23 DIAGNOSIS — I10 Essential (primary) hypertension: Secondary | ICD-10-CM

## 2012-02-23 DIAGNOSIS — Z79899 Other long term (current) drug therapy: Secondary | ICD-10-CM

## 2012-02-23 DIAGNOSIS — E782 Mixed hyperlipidemia: Secondary | ICD-10-CM

## 2012-02-23 DIAGNOSIS — E785 Hyperlipidemia, unspecified: Secondary | ICD-10-CM | POA: Insufficient documentation

## 2012-02-23 MED ORDER — ISOSORBIDE MONONITRATE ER 120 MG PO TB24: 120.0000 mg | ORAL_TABLET | ORAL | Status: DC

## 2012-02-23 MED ORDER — ATORVASTATIN CALCIUM 80 MG PO TABS: 80.0000 mg | ORAL_TABLET | Freq: Every day | ORAL | Status: DC

## 2012-02-23 MED ORDER — ISOSORBIDE MONONITRATE ER 120 MG PO TB24: ORAL_TABLET | ORAL | Status: DC

## 2012-02-23 MED ORDER — HYDROCHLOROTHIAZIDE 25 MG PO TABS: 12.5000 mg | ORAL_TABLET | Freq: Every day | ORAL | Status: DC

## 2012-02-23 NOTE — Assessment & Plan Note (Signed)
Recommendation is to try to optimize medical therapy, in conjunction with encouraging increased  activity. Will refer him to cardiac rehabilitation, and adjust medications with increase in Imdur to 180 mg every morning. Will also start HCTZ 12.5 mg daily both for BP control, as well as for treatment of mild, chronic LE edema. Will check followup labs in one week. Will schedule early return visit with me/Dr. Andee Lineman, for continued close monitoring and further optimization of medications.

## 2012-02-23 NOTE — Assessment & Plan Note (Signed)
HCTZ 12.5 mg daily to be added

## 2012-02-23 NOTE — Assessment & Plan Note (Signed)
We'll initiate statin therapy with Lipitor 80 mg daily. Aggressive management recommended with target LDL 70 or less, if feasible. Recent LDL 120. We'll reassess lipid status with a FLP/LFT profile in 12 weeks.

## 2012-02-23 NOTE — Progress Notes (Signed)
HPI: Patient presents for post hospital followup, following transfer from [facility] hospital to [facility] for cardiac catheterization.  Patient was referred to [place] in consultation for evaluation of CP, and ruled in for NST EMI with peak troponin 0.06 (normal CPK/MBs). We recommended aggressive evaluation with coronary angiography. Of note, previous catheterization, 2011, indicated patent L-DX-LAD and S-AM-PD-PL grafts, with 100% occluded S-0M1-0M2 grafts; EF 60%.   This current study yielded severe native CAD with significant graft disease, unchanged from prior study of 2/11; EF 55%. Medical therapy was recommended, with titration of Imdur, Toprol, and amlodipine.  Clinically, patient continues to have angina, albeit of lesser intensity. This typically is characterized by throat/neck discomfort, with occasional radiation to the jaw. He denies any CP. He has significant DOE with minimal exertion. He has had to take NTG on occasion, but not more than 1 tablet, with prompt relief.  She reports no complications of the R. groin incision site.  Allergies  Allergen Reactions  . Codeine Nausea And Vomiting    Current Outpatient Prescriptions  Medication Sig Dispense Refill  . amLODipine (NORVASC) 10 MG tablet Take 1 tablet (10 mg total) by mouth daily.  30 tablet  6  . aspirin (ASPIR-LOW) 81 MG EC tablet Take 81 mg by mouth daily.        . calcium carbonate (TUMS) 500 MG chewable tablet Chew 2 tablets by mouth daily.        . clopidogrel (PLAVIX) 75 MG tablet Take 75 mg by mouth daily.        Marland Kitchen docusate sodium (COLACE) 100 MG capsule Take 200 mg by mouth daily.       . isosorbide mononitrate (IMDUR) 60 MG 24 hr tablet Take 1 tablet (60 mg total) by mouth 2 (two) times daily.  135 tablet  3  . LORazepam (ATIVAN) 0.5 MG tablet Take 0.5 mg by mouth at bedtime.       . metoprolol tartrate (LOPRESSOR) 25 MG tablet Take 1 tablet (25 mg total) by mouth 2 (two) times daily.  60 tablet  6  . nitroGLYCERIN  (NITROSTAT) 0.4 MG SL tablet Place 0.4 mg under the tongue every 5 (five) minutes as needed. For chest pain      . pantoprazole (PROTONIX) 40 MG tablet Take 40 mg by mouth daily.        . simvastatin (ZOCOR) 40 MG tablet Take 40 mg by mouth daily.        . VOLTAREN 1 % GEL Apply 1 application topically as needed. For arthritis        Past Medical History  Diagnosis Date  . Hyperlipidemia   . Hypertension   . Vertigo   . Osteoarthritis   . Prostate cancer   . Symptomatic bradycardia     s/p PPM  . Complication of anesthesia     difficult waking up  . Angina   . Shortness of breath   . Coronary artery disease     a. s/p cabg x  ; b. 02/2012 cath: 3vd, vg-om1-2 known to be occluded, vg-R acute marginal - PDA - PL - looked good to AM & PD w/ occlusion of native PD. Graft ok to PL w/ 95% stenosis @ insertion to PL. Native PL 90% diffuse (unchanged from 2/11). Lima-LAD patent w L->R collats.  EF 55% - med rx   . Myocardial infarction   . GERD (gastroesophageal reflux disease)     Past Surgical History  Procedure Date  . Coronary artery bypass graft   .  Cardiac catheterization   . Cholecystectomy   . Prostatectomy   . Ppm 6/11  . Insert / replace / remove pacemaker     History   Social History  . Marital Status: Married    Spouse Name: N/A    Number of Children: N/A  . Years of Education: N/A   Occupational History  . Not on file.   Social History Main Topics  . Smoking status: Never Smoker   . Smokeless tobacco: Never Used  . Alcohol Use: No  . Drug Use: No  . Sexually Active: No   Other Topics Concern  . Not on file   Social History Narrative   Retired and married.     Family History  Problem Relation Age of Onset  . Coronary artery disease Mother   . Coronary artery disease Father   . Coronary artery disease Other     ROS: no nausea, vomiting; no fever, chills; no melena, hematochezia; no claudication  PHYSICAL EXAM: BP 155/75  Pulse 78  Ht 5\' 10"   (1.778 m)  Wt 237 lb (107.502 kg)  BMI 34.01 kg/m2  SpO2 93% GENERAL: 78 year old male, morbidly obese, sitting upright; NAD HEENT: NCAT, PERRLA, EOMI; sclera clear; no xanthelasma NECK: palpable bilateral carotid pulses, no bruits; no JVD; no TM LUNGS: CTA bilaterally CARDIAC: RRR (S1, S2); no significant murmurs; no rubs or gallops ABDOMEN: Protuberant EXTREMETIES: Trace peripheral edema SKIN: warm/dry; no obvious rash/lesions MUSCULOSKELETAL: no joint deformity NEURO: no focal deficit; NL affect   EKG:    ASSESSMENT & PLAN:

## 2012-02-23 NOTE — Patient Instructions (Signed)
   Follow up in 1 month.  Referral to Cardiac Rehab  Stop Zocor (simvastatin)  Start Lipitor (atorvastatin) 80 mg every evening.  Start HCTZ 25 mg 1/2 tablet (12.5 mg) daily.  Increase Imdur (isosorbide) to 180 mg every morning. This will be 3 of your 60 mg tablets until gone and then start 120 mg tablets-take 1 1/2 of the 120 mg tablets. Your physician recommends that you go to the [facility] for a FASTING lipid profile and liver function labs. Do not eat or drink after midnight.  DO LAB AROUND 05/17/12. Your physician recommends that you go to the [facility] for lab work: BMET DO IN 1 WEEK.

## 2012-02-23 NOTE — Assessment & Plan Note (Signed)
No evidence of decompensation by history or PE

## 2012-03-01 ENCOUNTER — Telehealth: Payer: Self-pay | Admitting: Physician Assistant

## 2012-03-01 NOTE — Telephone Encounter (Signed)
Spoke with Chales Abrahams and she confirmed that cardiac rehabilitation received our referral for Mr. Sollenberger. She verified that Bonita Quin from the cardiac department will be contacting Mr. Pavone on Monday to set Up his appointment.  I called Webb Laws and left her a message regarding this.

## 2012-03-04 DIAGNOSIS — I10 Essential (primary) hypertension: Secondary | ICD-10-CM | POA: Diagnosis not present

## 2012-03-04 DIAGNOSIS — E782 Mixed hyperlipidemia: Secondary | ICD-10-CM | POA: Diagnosis not present

## 2012-03-04 DIAGNOSIS — Z79899 Other long term (current) drug therapy: Secondary | ICD-10-CM | POA: Diagnosis not present

## 2012-03-05 ENCOUNTER — Encounter: Payer: Self-pay | Admitting: *Deleted

## 2012-03-08 DIAGNOSIS — J209 Acute bronchitis, unspecified: Secondary | ICD-10-CM | POA: Diagnosis not present

## 2012-03-08 DIAGNOSIS — H10029 Other mucopurulent conjunctivitis, unspecified eye: Secondary | ICD-10-CM | POA: Diagnosis not present

## 2012-03-18 DIAGNOSIS — I10 Essential (primary) hypertension: Secondary | ICD-10-CM | POA: Diagnosis not present

## 2012-03-18 DIAGNOSIS — M129 Arthropathy, unspecified: Secondary | ICD-10-CM | POA: Diagnosis not present

## 2012-03-18 DIAGNOSIS — K5909 Other constipation: Secondary | ICD-10-CM | POA: Diagnosis not present

## 2012-03-18 DIAGNOSIS — I209 Angina pectoris, unspecified: Secondary | ICD-10-CM | POA: Diagnosis not present

## 2012-03-18 DIAGNOSIS — K219 Gastro-esophageal reflux disease without esophagitis: Secondary | ICD-10-CM | POA: Diagnosis not present

## 2012-03-25 DIAGNOSIS — I219 Acute myocardial infarction, unspecified: Secondary | ICD-10-CM | POA: Diagnosis not present

## 2012-03-25 DIAGNOSIS — I2510 Atherosclerotic heart disease of native coronary artery without angina pectoris: Secondary | ICD-10-CM | POA: Diagnosis not present

## 2012-03-25 DIAGNOSIS — I209 Angina pectoris, unspecified: Secondary | ICD-10-CM | POA: Diagnosis not present

## 2012-03-25 DIAGNOSIS — Z951 Presence of aortocoronary bypass graft: Secondary | ICD-10-CM | POA: Diagnosis not present

## 2012-03-25 DIAGNOSIS — Z5189 Encounter for other specified aftercare: Secondary | ICD-10-CM | POA: Diagnosis not present

## 2012-03-26 DIAGNOSIS — I209 Angina pectoris, unspecified: Secondary | ICD-10-CM | POA: Diagnosis not present

## 2012-03-26 DIAGNOSIS — Z5189 Encounter for other specified aftercare: Secondary | ICD-10-CM | POA: Diagnosis not present

## 2012-03-26 DIAGNOSIS — Z951 Presence of aortocoronary bypass graft: Secondary | ICD-10-CM | POA: Diagnosis not present

## 2012-03-26 DIAGNOSIS — I219 Acute myocardial infarction, unspecified: Secondary | ICD-10-CM | POA: Diagnosis not present

## 2012-03-26 DIAGNOSIS — I2510 Atherosclerotic heart disease of native coronary artery without angina pectoris: Secondary | ICD-10-CM | POA: Diagnosis not present

## 2012-04-01 DIAGNOSIS — I2510 Atherosclerotic heart disease of native coronary artery without angina pectoris: Secondary | ICD-10-CM | POA: Diagnosis not present

## 2012-04-01 DIAGNOSIS — I219 Acute myocardial infarction, unspecified: Secondary | ICD-10-CM | POA: Diagnosis not present

## 2012-04-01 DIAGNOSIS — Z951 Presence of aortocoronary bypass graft: Secondary | ICD-10-CM | POA: Diagnosis not present

## 2012-04-01 DIAGNOSIS — I209 Angina pectoris, unspecified: Secondary | ICD-10-CM | POA: Diagnosis not present

## 2012-04-01 DIAGNOSIS — Z5189 Encounter for other specified aftercare: Secondary | ICD-10-CM | POA: Diagnosis not present

## 2012-04-02 DIAGNOSIS — Z5189 Encounter for other specified aftercare: Secondary | ICD-10-CM | POA: Diagnosis not present

## 2012-04-02 DIAGNOSIS — Z951 Presence of aortocoronary bypass graft: Secondary | ICD-10-CM | POA: Diagnosis not present

## 2012-04-02 DIAGNOSIS — I219 Acute myocardial infarction, unspecified: Secondary | ICD-10-CM | POA: Diagnosis not present

## 2012-04-02 DIAGNOSIS — I2510 Atherosclerotic heart disease of native coronary artery without angina pectoris: Secondary | ICD-10-CM | POA: Diagnosis not present

## 2012-04-02 DIAGNOSIS — I209 Angina pectoris, unspecified: Secondary | ICD-10-CM | POA: Diagnosis not present

## 2012-04-04 DIAGNOSIS — Z951 Presence of aortocoronary bypass graft: Secondary | ICD-10-CM | POA: Diagnosis not present

## 2012-04-04 DIAGNOSIS — I209 Angina pectoris, unspecified: Secondary | ICD-10-CM | POA: Diagnosis not present

## 2012-04-04 DIAGNOSIS — Z5189 Encounter for other specified aftercare: Secondary | ICD-10-CM | POA: Diagnosis not present

## 2012-04-04 DIAGNOSIS — I2510 Atherosclerotic heart disease of native coronary artery without angina pectoris: Secondary | ICD-10-CM | POA: Diagnosis not present

## 2012-04-04 DIAGNOSIS — I219 Acute myocardial infarction, unspecified: Secondary | ICD-10-CM | POA: Diagnosis not present

## 2012-04-08 DIAGNOSIS — I2510 Atherosclerotic heart disease of native coronary artery without angina pectoris: Secondary | ICD-10-CM | POA: Diagnosis not present

## 2012-04-08 DIAGNOSIS — Z5189 Encounter for other specified aftercare: Secondary | ICD-10-CM | POA: Diagnosis not present

## 2012-04-08 DIAGNOSIS — Z951 Presence of aortocoronary bypass graft: Secondary | ICD-10-CM | POA: Diagnosis not present

## 2012-04-08 DIAGNOSIS — I209 Angina pectoris, unspecified: Secondary | ICD-10-CM | POA: Diagnosis not present

## 2012-04-08 DIAGNOSIS — I219 Acute myocardial infarction, unspecified: Secondary | ICD-10-CM | POA: Diagnosis not present

## 2012-04-09 DIAGNOSIS — I2510 Atherosclerotic heart disease of native coronary artery without angina pectoris: Secondary | ICD-10-CM | POA: Diagnosis not present

## 2012-04-09 DIAGNOSIS — Z951 Presence of aortocoronary bypass graft: Secondary | ICD-10-CM | POA: Diagnosis not present

## 2012-04-09 DIAGNOSIS — I209 Angina pectoris, unspecified: Secondary | ICD-10-CM | POA: Diagnosis not present

## 2012-04-09 DIAGNOSIS — I219 Acute myocardial infarction, unspecified: Secondary | ICD-10-CM | POA: Diagnosis not present

## 2012-04-09 DIAGNOSIS — Z5189 Encounter for other specified aftercare: Secondary | ICD-10-CM | POA: Diagnosis not present

## 2012-04-11 DIAGNOSIS — I209 Angina pectoris, unspecified: Secondary | ICD-10-CM | POA: Diagnosis not present

## 2012-04-11 DIAGNOSIS — Z951 Presence of aortocoronary bypass graft: Secondary | ICD-10-CM | POA: Diagnosis not present

## 2012-04-11 DIAGNOSIS — Z5189 Encounter for other specified aftercare: Secondary | ICD-10-CM | POA: Diagnosis not present

## 2012-04-11 DIAGNOSIS — I2510 Atherosclerotic heart disease of native coronary artery without angina pectoris: Secondary | ICD-10-CM | POA: Diagnosis not present

## 2012-04-11 DIAGNOSIS — I252 Old myocardial infarction: Secondary | ICD-10-CM | POA: Diagnosis not present

## 2012-04-22 DIAGNOSIS — H9209 Otalgia, unspecified ear: Secondary | ICD-10-CM | POA: Diagnosis not present

## 2012-05-10 ENCOUNTER — Telehealth: Payer: Self-pay | Admitting: *Deleted

## 2012-05-10 NOTE — Telephone Encounter (Signed)
Patient wanted to verify his imdur dose and directions since he had a bottle of the 60 mg there. Nurse advised him that he could either take 1 of the 60 mg with one of the 120 mg or he could just take 1&1/2 of the 120 mg daily. He is to take a total of 180 mg daily. Patient verbalized understanding of plan.

## 2012-06-21 ENCOUNTER — Ambulatory Visit (INDEPENDENT_AMBULATORY_CARE_PROVIDER_SITE_OTHER): Payer: Medicare Other | Admitting: *Deleted

## 2012-06-21 ENCOUNTER — Encounter: Payer: Self-pay | Admitting: Internal Medicine

## 2012-06-21 DIAGNOSIS — I498 Other specified cardiac arrhythmias: Secondary | ICD-10-CM | POA: Diagnosis not present

## 2012-06-21 DIAGNOSIS — R001 Bradycardia, unspecified: Secondary | ICD-10-CM

## 2012-06-21 LAB — PACEMAKER DEVICE OBSERVATION
AL AMPLITUDE: 1.4 mv
AL IMPEDENCE PM: 553 Ohm
AL THRESHOLD: 0.875 V
ATRIAL PACING PM: 97
RV LEAD AMPLITUDE: 22.4 mv
RV LEAD IMPEDENCE PM: 440 Ohm
RV LEAD THRESHOLD: 0.875 V

## 2012-06-21 NOTE — Progress Notes (Signed)
Pacer check in clinic  

## 2012-09-06 DIAGNOSIS — Z23 Encounter for immunization: Secondary | ICD-10-CM | POA: Diagnosis not present

## 2012-09-18 DIAGNOSIS — R300 Dysuria: Secondary | ICD-10-CM | POA: Diagnosis not present

## 2012-10-07 DIAGNOSIS — K219 Gastro-esophageal reflux disease without esophagitis: Secondary | ICD-10-CM | POA: Diagnosis not present

## 2012-10-07 DIAGNOSIS — I209 Angina pectoris, unspecified: Secondary | ICD-10-CM | POA: Diagnosis not present

## 2012-10-07 DIAGNOSIS — M129 Arthropathy, unspecified: Secondary | ICD-10-CM | POA: Diagnosis not present

## 2012-10-07 DIAGNOSIS — K5909 Other constipation: Secondary | ICD-10-CM | POA: Diagnosis not present

## 2012-10-07 DIAGNOSIS — I10 Essential (primary) hypertension: Secondary | ICD-10-CM | POA: Diagnosis not present

## 2012-12-23 ENCOUNTER — Encounter: Payer: Medicare Other | Admitting: Internal Medicine

## 2013-01-08 ENCOUNTER — Encounter: Payer: Medicare Other | Admitting: Internal Medicine

## 2013-01-20 ENCOUNTER — Encounter: Payer: Self-pay | Admitting: Internal Medicine

## 2013-01-20 ENCOUNTER — Ambulatory Visit (INDEPENDENT_AMBULATORY_CARE_PROVIDER_SITE_OTHER): Payer: Medicare Other | Admitting: Internal Medicine

## 2013-01-20 VITALS — BP 157/79 | HR 76 | Ht 71.0 in | Wt 237.0 lb

## 2013-01-20 DIAGNOSIS — R001 Bradycardia, unspecified: Secondary | ICD-10-CM

## 2013-01-20 DIAGNOSIS — E785 Hyperlipidemia, unspecified: Secondary | ICD-10-CM

## 2013-01-20 DIAGNOSIS — Z79899 Other long term (current) drug therapy: Secondary | ICD-10-CM | POA: Diagnosis not present

## 2013-01-20 DIAGNOSIS — I2510 Atherosclerotic heart disease of native coronary artery without angina pectoris: Secondary | ICD-10-CM | POA: Diagnosis not present

## 2013-01-20 DIAGNOSIS — I498 Other specified cardiac arrhythmias: Secondary | ICD-10-CM | POA: Diagnosis not present

## 2013-01-20 LAB — PACEMAKER DEVICE OBSERVATION
AL IMPEDENCE PM: 562 Ohm
AL THRESHOLD: 0.5 V
ATRIAL PACING PM: 99
BATTERY VOLTAGE: 2.8 V
RV LEAD IMPEDENCE PM: 450 Ohm
VENTRICULAR PACING PM: 0

## 2013-01-20 MED ORDER — ATORVASTATIN CALCIUM 80 MG PO TABS: 80.0000 mg | ORAL_TABLET | Freq: Every evening | ORAL | Status: DC

## 2013-01-20 NOTE — Progress Notes (Signed)
PCP:  Selinda Flavin, MD Primary Cardiologist:  Diona Browner  The patient presents today for routine electrophysiology followup.  Since last being seen in our clinic, the patient reports doing very well.  He remains quite active despite his age.  Today, he denies symptoms of palpitations, chest pain, shortness of breath, dizziness, presyncope, syncope, or neurologic sequela.  His edema is improved. The patient feels that he is tolerating medications without difficulties and is otherwise without complaint today.   Past Medical History  Diagnosis Date  . Hyperlipidemia   . Hypertension   . Vertigo   . Osteoarthritis   . Prostate cancer   . Symptomatic bradycardia     s/p PPM  . Complication of anesthesia     difficult waking up  . Angina   . Shortness of breath   . Coronary artery disease     a. s/p cabg x  ; b. 02/2012 cath: 3vd, vg-om1-2 known to be occluded, vg-R acute marginal - PDA - PL - looked good to AM & PD w/ occlusion of native PD. Graft ok to PL w/ 95% stenosis @ insertion to PL. Native PL 90% diffuse (unchanged from 2/11). Lima-LAD patent w L->R collats.  EF 55% - med rx   . Myocardial infarction   . GERD (gastroesophageal reflux disease)    Past Surgical History  Procedure Laterality Date  . Coronary artery bypass graft    . Cardiac catheterization    . Cholecystectomy    . Prostatectomy    . Ppm  6/11  . Insert / replace / remove pacemaker      Current Outpatient Prescriptions  Medication Sig Dispense Refill  . amLODipine (NORVASC) 10 MG tablet Take 1 tablet (10 mg total) by mouth daily.  30 tablet  6  . aspirin (ASPIR-LOW) 81 MG EC tablet Take 81 mg by mouth daily.        . calcium carbonate (TUMS) 500 MG chewable tablet Chew 2 tablets by mouth daily.        . clopidogrel (PLAVIX) 75 MG tablet Take 75 mg by mouth daily.        Marland Kitchen docusate sodium (COLACE) 100 MG capsule Take 200 mg by mouth daily.       . hydrochlorothiazide (HYDRODIURIL) 25 MG tablet Take 0.5 tablets  (12.5 mg total) by mouth daily.  45 tablet  3  . isosorbide mononitrate (IMDUR) 120 MG 24 hr tablet Take 1 1/2 tablets (180 mg) by mouth every morning.  135 tablet  3  . LORazepam (ATIVAN) 0.5 MG tablet Take 0.5 mg by mouth at bedtime.       . metoprolol tartrate (LOPRESSOR) 25 MG tablet Take 1 tablet (25 mg total) by mouth 2 (two) times daily.  60 tablet  6  . nitroGLYCERIN (NITROSTAT) 0.4 MG SL tablet Place 0.4 mg under the tongue every 5 (five) minutes as needed. For chest pain      . pantoprazole (PROTONIX) 40 MG tablet Take 40 mg by mouth daily.        Marland Kitchen triamcinolone cream (KENALOG) 0.1 % Apply 1 application topically 2 (two) times daily.       . VOLTAREN 1 % GEL Apply 1 application topically as needed. For arthritis      . atorvastatin (LIPITOR) 80 MG tablet Take 1 tablet (80 mg total) by mouth every evening.  90 tablet  3  . [DISCONTINUED] chlorthalidone (HYGROTON) 25 MG tablet Take 1 tablet (25 mg total) by mouth as needed.  30 tablet  0  . [DISCONTINUED] simvastatin (ZOCOR) 40 MG tablet Take 40 mg by mouth daily.         No current facility-administered medications for this visit.    Allergies  Allergen Reactions  . Codeine Nausea And Vomiting    History   Social History  . Marital Status: Married    Spouse Name: N/A    Number of Children: N/A  . Years of Education: N/A   Occupational History  . Not on file.   Social History Main Topics  . Smoking status: Never Smoker   . Smokeless tobacco: Never Used  . Alcohol Use: No  . Drug Use: No  . Sexually Active: No   Other Topics Concern  . Not on file   Social History Narrative   Retired and married.     Family History  Problem Relation Age of Onset  . Coronary artery disease Mother   . Coronary artery disease Father   . Coronary artery disease Other    Physical Exam: Filed Vitals:   01/20/13 1610  BP: 157/79  Pulse: 76  Height: 5\' 11"  (1.803 m)  Weight: 237 lb (107.502 kg)    GEN- The patient is well  appearing, alert and oriented x 3 today.   Head- normocephalic, atraumatic Eyes-  Sclera clear, conjunctiva pink Ears- hearing intact Oropharynx- clear Neck- supple, no JVP Lymph- no cervical lymphadenopathy Lungs- Clear to ausculation bilaterally, normal work of breathing Chest- pacemaker pocket is well healed Heart- Regular rate and rhythm, no murmurs, rubs or gallops, PMI not laterally displaced GI- soft, NT, ND, + BS Extremities- no clubbing, cyanosis, or edema Neuro- strength and sensation are intact  Pacemaker interrogation- reviewed in detail today,  See PACEART report  Assessment and Plan:  1. Sinus node dysfunction Normal pacemaker function See Pace Art report No changes today  2. CAD No ischemic symptoms Continue medical therapy  3. HL Pt reports being out of lipitor refills.  I will restart lipitor 80mg  qhs today.  LFTs and Lipids in 6 weeks  Return to see Dr Diona Browner in 2 months Return to the device clinic to see Belenda Cruise in 6 months I will see again in a year

## 2013-01-20 NOTE — Patient Instructions (Signed)
   Resume Lipitor 80mg  every evening  Labs for fasting lipid and liver panel in 6 weeks  Office will notify of results  Follow up with Diona Browner - 2 months  Device check - 6 months  Allred - 1 year

## 2013-03-05 ENCOUNTER — Other Ambulatory Visit: Payer: Self-pay | Admitting: Physician Assistant

## 2013-03-05 ENCOUNTER — Other Ambulatory Visit: Payer: Self-pay | Admitting: *Deleted

## 2013-03-05 MED ORDER — ISOSORBIDE MONONITRATE ER 120 MG PO TB24: ORAL_TABLET | ORAL | Status: DC

## 2013-03-06 DIAGNOSIS — I2510 Atherosclerotic heart disease of native coronary artery without angina pectoris: Secondary | ICD-10-CM | POA: Diagnosis not present

## 2013-03-06 DIAGNOSIS — Z79899 Other long term (current) drug therapy: Secondary | ICD-10-CM | POA: Diagnosis not present

## 2013-03-06 DIAGNOSIS — E785 Hyperlipidemia, unspecified: Secondary | ICD-10-CM | POA: Diagnosis not present

## 2013-03-10 ENCOUNTER — Encounter: Payer: Self-pay | Admitting: *Deleted

## 2013-03-30 ENCOUNTER — Encounter: Payer: Self-pay | Admitting: Cardiology

## 2013-03-30 DIAGNOSIS — Z950 Presence of cardiac pacemaker: Secondary | ICD-10-CM | POA: Insufficient documentation

## 2013-04-03 ENCOUNTER — Ambulatory Visit (INDEPENDENT_AMBULATORY_CARE_PROVIDER_SITE_OTHER): Payer: Medicare Other | Admitting: Cardiology

## 2013-04-03 ENCOUNTER — Other Ambulatory Visit: Payer: Self-pay | Admitting: *Deleted

## 2013-04-03 ENCOUNTER — Encounter: Payer: Self-pay | Admitting: Cardiology

## 2013-04-03 VITALS — BP 160/79 | HR 88 | Ht 71.0 in | Wt 239.0 lb

## 2013-04-03 DIAGNOSIS — Z950 Presence of cardiac pacemaker: Secondary | ICD-10-CM

## 2013-04-03 DIAGNOSIS — I10 Essential (primary) hypertension: Secondary | ICD-10-CM | POA: Diagnosis not present

## 2013-04-03 DIAGNOSIS — I2510 Atherosclerotic heart disease of native coronary artery without angina pectoris: Secondary | ICD-10-CM | POA: Diagnosis not present

## 2013-04-03 DIAGNOSIS — E785 Hyperlipidemia, unspecified: Secondary | ICD-10-CM | POA: Diagnosis not present

## 2013-04-03 MED ORDER — HYDROCHLOROTHIAZIDE 25 MG PO TABS: 12.5000 mg | ORAL_TABLET | Freq: Every day | ORAL | Status: DC

## 2013-04-03 MED ORDER — AMLODIPINE BESYLATE 10 MG PO TABS: 10.0000 mg | ORAL_TABLET | Freq: Every day | ORAL | Status: DC

## 2013-04-03 MED ORDER — METOPROLOL TARTRATE 25 MG PO TABS: 25.0000 mg | ORAL_TABLET | Freq: Two times a day (BID) | ORAL | Status: DC

## 2013-04-03 MED ORDER — ISOSORBIDE MONONITRATE ER 120 MG PO TB24: ORAL_TABLET | ORAL | Status: DC

## 2013-04-03 MED ORDER — ATORVASTATIN CALCIUM 80 MG PO TABS: 80.0000 mg | ORAL_TABLET | Freq: Every evening | ORAL | Status: DC

## 2013-04-03 MED ORDER — CLOPIDOGREL BISULFATE 75 MG PO TABS: 75.0000 mg | ORAL_TABLET | Freq: Every day | ORAL | Status: DC

## 2013-04-03 NOTE — Assessment & Plan Note (Signed)
Plan to continue medical therapy for stable angina. No changes were made today. Additions might be to consider an increase in beta blocker dosage, or perhaps even Ranexa. Followup arranged.

## 2013-04-03 NOTE — Assessment & Plan Note (Signed)
Recent LDL at goal. Continue Lipitor.  

## 2013-04-03 NOTE — Progress Notes (Signed)
Clinical Summary [name] is an 79 y.o.male last seen by [name] in March 2013, a former patient of Dr. Andee Figueroa. He was seen recently by Dr. Johney Figueroa in February. He is here with his daughter today. He reports regular exertional angina, describes jaw pain that resolves with rest and nitroglycerin. This has not progressed. Most recent cardiac catheterization is noted below with plan for medical therapy.  Recent lab work from March showed normal LFTs, cholesterol 120, HDL 43, LDL 57, triglycerides 100.  Echocardiogram in May 2012 demonstrated overall normal LV function with mild LVH, trace aortic regurgitation, mild mitral regurgitation, pacer wire in the right heart with trace tricuspid regurgitation.  We reviewed his medications today.   Allergies  Allergen Reactions  . Codeine Nausea And Vomiting    Current Outpatient Prescriptions  Medication Sig Dispense Refill  . amLODipine (NORVASC) 10 MG tablet Take 1 tablet (10 mg total) by mouth daily.  30 tablet  6  . aspirin (ASPIR-LOW) 81 MG EC tablet Take 81 mg by mouth daily.        Marland Kitchen atorvastatin (LIPITOR) 80 MG tablet Take 1 tablet (80 mg total) by mouth every evening.  90 tablet  3  . calcium carbonate (TUMS) 500 MG chewable tablet Chew 2 tablets by mouth daily.        . clopidogrel (PLAVIX) 75 MG tablet Take 75 mg by mouth daily.        Marland Kitchen docusate sodium (COLACE) 100 MG capsule Take 200 mg by mouth daily.       . hydrochlorothiazide (HYDRODIURIL) 25 MG tablet TAKE 1/2 TABLET BY MOUTH DAILY.  15 tablet  3  . isosorbide mononitrate (IMDUR) 120 MG 24 hr tablet Take 1 1/2 tablets (180 mg) by mouth every morning.  135 tablet  3  . LORazepam (ATIVAN) 0.5 MG tablet Take 0.5 mg by mouth at bedtime.       . metoprolol tartrate (LOPRESSOR) 25 MG tablet Take 1 tablet (25 mg total) by mouth 2 (two) times daily.  60 tablet  6  . nitroGLYCERIN (NITROSTAT) 0.4 MG SL tablet Place 0.4 mg under the tongue every 5 (five) minutes as needed. For chest pain       . pantoprazole (PROTONIX) 40 MG tablet Take 40 mg by mouth daily.        Marland Kitchen triamcinolone cream (KENALOG) 0.1 % Apply 1 application topically 2 (two) times daily.       . VOLTAREN 1 % GEL Apply 1 application topically as needed. For arthritis      . [DISCONTINUED] chlorthalidone (HYGROTON) 25 MG tablet Take 1 tablet (25 mg total) by mouth as needed.  30 tablet  0  . [DISCONTINUED] simvastatin (ZOCOR) 40 MG tablet Take 40 mg by mouth daily.         No current facility-administered medications for this visit.    Past Medical History  Diagnosis Date  . Hyperlipidemia   . Essential hypertension, benign   . Osteoarthritis   . Prostate cancer   . Symptomatic bradycardia     s/p PPM  . Coronary atherosclerosis of native coronary artery     Multivessel - 02/2012 cath: 3vd, svg-om1-2 known to be occluded, vg-R acute marginal - PDA - PL - looked good to AM & PD w/ occlusion of native PD. Graft ok to PL w/ 95% stenosis @ insertion to PL. Native PL 90% diffuse (unchanged from 2/11). Lima-LAD patent w L->R collats.  EF 55% - med rx   .  NSTEMI (non-ST elevated myocardial infarction)   . GERD (gastroesophageal reflux disease)     Past Surgical History  Procedure Laterality Date  . Coronary artery bypass graft    . Cholecystectomy    . Prostatectomy    . Ppm  6/11    Social History [name] reports that he has never smoked. He has never used smokeless tobacco. [name] reports that he does not drink alcohol.  Review of Systems No palpitations or dizziness. No reported bleeding problems. Has dependent edema that resolves by morning. Neuropathy in his feet. Stable appetite. Otherwise negative.  Physical Examination Filed Vitals:   04/03/13 1427  BP: 160/79  Pulse: 88   Filed Weights   04/03/13 1427  Weight: 239 lb (108.41 kg)   Overweight male in no acute distress. HEENT: Conjunctiva and lids normal, oropharynx clear. Neck: Supple, no elevated JVP or carotid bruits, no  thyromegaly. Lungs: Clear to auscultation, nonlabored breathing at rest. Cardiac: Regular rate and rhythm, no S3, soft systolic murmur, no pericardial rub. Abdomen: Soft, nontender,bowel sounds present. Extremities: 1+ edema, distal pulses 1-2+. Skin: Warm and dry. Musculoskeletal: No kyphosis. Neuropsychiatric: Alert and oriented x3, affect grossly appropriate.   Problem List and Plan   Coronary atherosclerosis of native coronary artery Plan to continue medical therapy for stable angina. No changes were made today. Additions might be to consider an increase in beta blocker dosage, or perhaps even Ranexa. Followup arranged.  Dyslipidemia Recent LDL at goal. Continue Lipitor.  Essential hypertension, benign Blood pressure is up today. Pending visit with Dr. Dimas Figueroa. No changes were made in his regimen.  Cardiac pacemaker in situ Keep followup with Dr. Johney Figueroa.    Bradley Figueroa, M.D., F.A.C.C.

## 2013-04-03 NOTE — Assessment & Plan Note (Signed)
Keep followup with Dr. Allred. 

## 2013-04-03 NOTE — Assessment & Plan Note (Signed)
Blood pressure is up today. Pending visit with Dr. Dimas Aguas. No changes were made in his regimen.

## 2013-04-14 DIAGNOSIS — K219 Gastro-esophageal reflux disease without esophagitis: Secondary | ICD-10-CM | POA: Diagnosis not present

## 2013-04-14 DIAGNOSIS — M129 Arthropathy, unspecified: Secondary | ICD-10-CM | POA: Diagnosis not present

## 2013-04-14 DIAGNOSIS — N3090 Cystitis, unspecified without hematuria: Secondary | ICD-10-CM | POA: Diagnosis not present

## 2013-04-14 DIAGNOSIS — R300 Dysuria: Secondary | ICD-10-CM | POA: Diagnosis not present

## 2013-04-14 DIAGNOSIS — I10 Essential (primary) hypertension: Secondary | ICD-10-CM | POA: Diagnosis not present

## 2013-05-27 ENCOUNTER — Telehealth: Payer: Self-pay | Admitting: Cardiology

## 2013-05-27 NOTE — Telephone Encounter (Signed)
Spoke with patient and he c/o having above left shoulder near pacemaker area that started about a month ago rated a 7-8. Patient say's the pain occurs every night at least twice.  Patient said he takes the nitroglycerin and its relieves the pain but it comes back and he has to take another tablet. Patient said the pain comes more at night when he is lying in the bed. No c/o dizziness or sob. Nurse advised patient to go to ED for evaluation. Patient verbalized understanding of plan.

## 2013-05-27 NOTE — Telephone Encounter (Signed)
Having pain in his upper shoulder past month and having to take nitro. Once he takes one or two he is able to sleep but comes back. PCP advised him to call us that it sounded cardiac

## 2013-05-28 ENCOUNTER — Ambulatory Visit (INDEPENDENT_AMBULATORY_CARE_PROVIDER_SITE_OTHER): Payer: Medicare Other | Admitting: Physician Assistant

## 2013-05-28 ENCOUNTER — Encounter: Payer: Self-pay | Admitting: Physician Assistant

## 2013-05-28 VITALS — BP 138/74 | HR 77 | Ht 70.0 in | Wt 239.8 lb

## 2013-05-28 DIAGNOSIS — I10 Essential (primary) hypertension: Secondary | ICD-10-CM

## 2013-05-28 DIAGNOSIS — I2510 Atherosclerotic heart disease of native coronary artery without angina pectoris: Secondary | ICD-10-CM

## 2013-05-28 DIAGNOSIS — R079 Chest pain, unspecified: Secondary | ICD-10-CM | POA: Diagnosis not present

## 2013-05-28 MED ORDER — ISOSORBIDE MONONITRATE ER 120 MG PO TB24: 240.0000 mg | ORAL_TABLET | Freq: Every day | ORAL | Status: DC

## 2013-05-28 NOTE — Assessment & Plan Note (Signed)
Stable on current regimen   

## 2013-05-28 NOTE — Assessment & Plan Note (Addendum)
Will increase Imdur to 240 mg a daily and reassess clinical status in one week, with me. Patient does have fresh NTG tablets. I reminded him to contact EMS, if his angina does not respond after a total of 3 NTG tablets. He presents with his daughter, a former Charity fundraiser, who understands. If he does not respond to maximal long-acting nitrate therapy, then we will consider either adding Ranexa or increasing current beta blocker dose, at time of next OV. Of note, I also instructed him to document his BP/HR during these nocturnal episodes, to see if there is any linear correlation with abnormal BP or HR.

## 2013-05-28 NOTE — Progress Notes (Signed)
Primary Cardiologist: Simona Huh, MD   HPI: Patient seen as an add-on for evaluation of CP, following refusal to go to the ED.  Patient presents with approximate one-month history of nightly left upper chest/biceps pain, which awakens him 1-2 hours after falling asleep. This is a relatively new phenomenon. He denies any associated jaw pain, which has been previously documented, and which he experiences with prolonged or strenuous walking. Last night he had a particularly bad episode (7/10), but with no associated dyspnea, diaphoresis or nausea. He took a total of 3 NTG tablets, with eventual relief after approximately 20 minutes. Of note, he typically does not experience this symptom during the day. He continues to experience occasional jaw pain with walking, but denies any exacerbation since last OV.   12-lead EKG today, reviewed by me, indicates NSR with first degree AV block 82 bpm; no acute changes  Allergies  Allergen Reactions  . Codeine Nausea And Vomiting    Current Outpatient Prescriptions  Medication Sig Dispense Refill  . amLODipine (NORVASC) 10 MG tablet Take 1 tablet (10 mg total) by mouth daily.  90 tablet  3  . aspirin (ASPIR-LOW) 81 MG EC tablet Take 81 mg by mouth daily.        Marland Kitchen atorvastatin (LIPITOR) 80 MG tablet Take 1 tablet (80 mg total) by mouth every evening.  90 tablet  3  . calcium carbonate (TUMS) 500 MG chewable tablet Chew 2 tablets by mouth daily.        . clopidogrel (PLAVIX) 75 MG tablet Take 1 tablet (75 mg total) by mouth daily.  90 tablet  3  . docusate sodium (COLACE) 100 MG capsule Take 200 mg by mouth daily.       . hydrochlorothiazide (HYDRODIURIL) 25 MG tablet Take 0.5 tablets (12.5 mg total) by mouth daily.  45 tablet  3  . isosorbide mononitrate (IMDUR) 120 MG 24 hr tablet Take 2 tablets (240 mg total) by mouth daily.  60 tablet  6  . LORazepam (ATIVAN) 0.5 MG tablet Take 0.5 mg by mouth at bedtime.       . metoprolol tartrate (LOPRESSOR) 25 MG  tablet Take 1 tablet (25 mg total) by mouth 2 (two) times daily.  180 tablet  3  . nitroGLYCERIN (NITROSTAT) 0.4 MG SL tablet Place 0.4 mg under the tongue every 5 (five) minutes as needed. For chest pain      . pantoprazole (PROTONIX) 40 MG tablet Take 40 mg by mouth daily.        Marland Kitchen triamcinolone cream (KENALOG) 0.1 % Apply 1 application topically 2 (two) times daily as needed.       . [DISCONTINUED] chlorthalidone (HYGROTON) 25 MG tablet Take 1 tablet (25 mg total) by mouth as needed.  30 tablet  0  . [DISCONTINUED] simvastatin (ZOCOR) 40 MG tablet Take 40 mg by mouth daily.         No current facility-administered medications for this visit.    Past Medical History  Diagnosis Date  . Hyperlipidemia   . Essential hypertension, benign   . Osteoarthritis   . Prostate cancer   . Symptomatic bradycardia     s/p PPM  . Coronary atherosclerosis of native coronary artery     Multivessel - 02/2012 cath: 3vd, svg-om1-2 known to be occluded, vg-R acute marginal - PDA - PL - looked good to AM & PD w/ occlusion of native PD. Graft ok to PL w/ 95% stenosis @ insertion to PL. Native PL 90%  diffuse (unchanged from 2/11). Lima-LAD patent w L->R collats.  EF 55% - med rx   . NSTEMI (non-ST elevated myocardial infarction)   . GERD (gastroesophageal reflux disease)     Past Surgical History  Procedure Laterality Date  . Coronary artery bypass graft    . Cholecystectomy    . Prostatectomy    . Ppm  6/11    History   Social History  . Marital Status: Married    Spouse Name: N/A    Number of Children: N/A  . Years of Education: N/A   Occupational History  . Not on file.   Social History Main Topics  . Smoking status: Never Smoker   . Smokeless tobacco: Never Used  . Alcohol Use: No  . Drug Use: No  . Sexually Active: No   Other Topics Concern  . Not on file   Social History Narrative   Retired and married.     Family History  Problem Relation Age of Onset  . Coronary artery  disease Mother   . Coronary artery disease Father     ROS: no nausea, vomiting; no fever, chills; no melena, hematochezia; no claudication  PHYSICAL EXAM: BP 138/74  Pulse 77  Ht 5\' 10"  (1.778 m)  Wt 239 lb 12.8 oz (108.773 kg)  BMI 34.41 kg/m2  SpO2 97% GENERAL: 79 year-old male, obese; NAD HEENT: NCAT, PERRLA, EOMI; sclera clear; no xanthelasma NECK: palpable bilateral carotid pulses, no bruits; no JVD; no TM LUNGS: CTA bilaterally CARDIAC: RRR (S1, S2); no significant murmurs; no rubs or gallops ABDOMEN: soft, protuberant EXTREMETIES: no significant peripheral edema SKIN: warm/dry; no obvious rash/lesions MUSCULOSKELETAL: no joint deformity NEURO: no focal deficit; NL affect   EKG: reviewed and available in Electronic Records   ASSESSMENT & PLAN:  Coronary atherosclerosis of native coronary artery Will increase Imdur to 240 mg a daily and reassess clinical status in one week, with me. Patient does have fresh NTG tablets. I reminded him to contact EMS, if his angina does not respond after a total of 3 NTG tablets. He presents with his daughter, a former Char[employer] fundraiser, who understands. If he does not respond to maximal long-acting nitrate therapy, then we will consider either adding Ranexa or increasing current beta blocker dose, at time of next OV. Of note, I also instructed him to document his BP/HR during these nocturnal episodes, to see if there is any linear correlation with abnormal BP or HR.  Essential hypertension, benign Stable on current regimen    Gene Riyanshi Wahab, PAC

## 2013-05-28 NOTE — Patient Instructions (Signed)
   Check blood pressure during chest pain spells  Increase Imdur to 240mg  daily - new sent to pharm  Continue all other current medications. Follow up in  1 week

## 2013-05-29 ENCOUNTER — Ambulatory Visit: Payer: Medicare Other | Admitting: Physician Assistant

## 2013-05-29 DIAGNOSIS — M1710 Unilateral primary osteoarthritis, unspecified knee: Secondary | ICD-10-CM | POA: Diagnosis not present

## 2013-06-04 ENCOUNTER — Ambulatory Visit (INDEPENDENT_AMBULATORY_CARE_PROVIDER_SITE_OTHER): Payer: Medicare Other | Admitting: Physician Assistant

## 2013-06-04 ENCOUNTER — Encounter: Payer: Self-pay | Admitting: Physician Assistant

## 2013-06-04 VITALS — BP 116/70 | HR 86 | Ht 71.0 in | Wt 241.0 lb

## 2013-06-04 DIAGNOSIS — I2510 Atherosclerotic heart disease of native coronary artery without angina pectoris: Secondary | ICD-10-CM | POA: Diagnosis not present

## 2013-06-04 DIAGNOSIS — I10 Essential (primary) hypertension: Secondary | ICD-10-CM

## 2013-06-04 NOTE — Progress Notes (Signed)
Primary Cardiologist: Simona Huh, MD   HPI: Scheduled 1 week followup.  Following recent OV, I increased Imdur to 240 mg daily for aggressive medical management of breakthrough angina pectoris. If needed, I recommended adding Ranexa for increasing beta blocker dose at followup.  He returns today reporting possibly some recent benefit from the increased dose of Imdur. He denied having any nocturnal angina last evening, the first time in quite a while. Two nights ago he had some mild discomfort, for which he took 1 NTG tablet with relief after 15 or 20 minutes. Of note, he has never had to take more than one NTG tablet at a time. He is also concerned, however, that he is experiencing adverse reaction to full dose Lipitor, manifesting as the left upper chest discomfort, or as intense left calf pain that he experiences when laying in bed at night.  Allergies  Allergen Reactions  . Codeine Nausea And Vomiting    Current Outpatient Prescriptions  Medication Sig Dispense Refill  . amLODipine (NORVASC) 10 MG tablet Take 1 tablet (10 mg total) by mouth daily.  90 tablet  3  . aspirin (ASPIR-LOW) 81 MG EC tablet Take 81 mg by mouth daily.        . calcium carbonate (TUMS) 500 MG chewable tablet Chew 2 tablets by mouth daily.        . clopidogrel (PLAVIX) 75 MG tablet Take 1 tablet (75 mg total) by mouth daily.  90 tablet  3  . docusate sodium (COLACE) 100 MG capsule Take 200 mg by mouth daily.       . hydrochlorothiazide (HYDRODIURIL) 25 MG tablet Take 0.5 tablets (12.5 mg total) by mouth daily.  45 tablet  3  . isosorbide mononitrate (IMDUR) 120 MG 24 hr tablet Take 2 tablets (240 mg total) by mouth daily.  60 tablet  6  . LORazepam (ATIVAN) 0.5 MG tablet Take 0.5 mg by mouth at bedtime.       . metoprolol tartrate (LOPRESSOR) 25 MG tablet Take 1 tablet (25 mg total) by mouth 2 (two) times daily.  180 tablet  3  . nitroGLYCERIN (NITROSTAT) 0.4 MG SL tablet Place 0.4 mg under the tongue every 5  (five) minutes as needed. For chest pain      . pantoprazole (PROTONIX) 40 MG tablet Take 40 mg by mouth daily.        Marland Kitchen triamcinolone cream (KENALOG) 0.1 % Apply 1 application topically 2 (two) times daily as needed.       . [DISCONTINUED] chlorthalidone (HYGROTON) 25 MG tablet Take 1 tablet (25 mg total) by mouth as needed.  30 tablet  0  . [DISCONTINUED] simvastatin (ZOCOR) 40 MG tablet Take 40 mg by mouth daily.         No current facility-administered medications for this visit.    Past Medical History  Diagnosis Date  . Hyperlipidemia   . Essential hypertension, benign   . Osteoarthritis   . Prostate cancer   . Symptomatic bradycardia     s/p PPM  . Coronary atherosclerosis of native coronary artery     Multivessel - 02/2012 cath: 3vd, svg-om1-2 known to be occluded, vg-R acute marginal - PDA - PL - looked good to AM & PD w/ occlusion of native PD. Graft ok to PL w/ 95% stenosis @ insertion to PL. Native PL 90% diffuse (unchanged from 2/11). Lima-LAD patent w L->R collats.  EF 55% - med rx   . NSTEMI (non-ST elevated myocardial infarction)   .  GERD (gastroesophageal reflux disease)     Past Surgical History  Procedure Laterality Date  . Coronary artery bypass graft    . Cholecystectomy    . Prostatectomy    . Ppm  6/11    History   Social History  . Marital Status: Married    Spouse Name: N/A    Number of Children: N/A  . Years of Education: N/A   Occupational History  . Not on file.   Social History Main Topics  . Smoking status: Never Smoker   . Smokeless tobacco: Never Used  . Alcohol Use: No  . Drug Use: No  . Sexually Active: No   Other Topics Concern  . Not on file   Social History Narrative   Retired and married.    Social History Narrative   Retired and married.     Problem Relation Age of Onset  . Coronary artery disease Mother   . Coronary artery disease Father     ROS: no nausea, vomiting; no fever, chills; no melena, hematochezia; no  claudication  PHYSICAL EXAM: BP 116/70  Pulse 86  Ht 5\' 11"  (1.803 m)  Wt 241 lb (109.317 kg)  BMI 33.63 kg/m2 GENERAL: 79 year-old male, obese; NAD  HEENT: NCAT, PERRLA, EOMI; sclera clear; no xanthelasma  NECK: palpable bilateral carotid pulses, no bruits; no JVD; no TM  LUNGS: CTA bilaterally  CARDIAC: RRR (S1, S2); no significant murmurs; no rubs or gallops  ABDOMEN: soft, protuberant  EXTREMETIES: no significant peripheral edema  SKIN: warm/dry; no obvious rash/lesions  MUSCULOSKELETAL: no joint deformity  NEURO: no focal deficit; NL affect    EKG:   ASSESSMENT & PLAN:  Coronary atherosclerosis of native coronary artery Continue monitoring closely on current antianginal regimen, including recent increased dose of Imdur. I will not add Ranexa or increase beta blocker at present time. Rather, we will temporarily hold Lipitor and reassess clinical status in 2 weeks.  Essential hypertension, benign Remains very well controlled. Of note, patient's daughter noted NL BP (120/80) during one recent episode of nocturnal angina.    Gene Shawnita Krizek, PAC

## 2013-06-04 NOTE — Assessment & Plan Note (Signed)
Remains very well controlled. Of note, patient's daughter noted NL BP (120/80) during one recent episode of nocturnal angina.

## 2013-06-04 NOTE — Patient Instructions (Addendum)
Your physician recommends that you schedule a follow-up appointment in: 2 weeks with Dr. Diona Browner.  Stop Lipitor.

## 2013-06-04 NOTE — Assessment & Plan Note (Signed)
Continue monitoring closely on current antianginal regimen, including recent increased dose of Imdur. I will not add Ranexa or increase beta blocker at present time. Rather, we will temporarily hold Lipitor and reassess clinical status in 2 weeks.

## 2013-06-18 DIAGNOSIS — M1710 Unilateral primary osteoarthritis, unspecified knee: Secondary | ICD-10-CM | POA: Diagnosis not present

## 2013-06-19 ENCOUNTER — Ambulatory Visit (INDEPENDENT_AMBULATORY_CARE_PROVIDER_SITE_OTHER): Payer: Medicare Other | Admitting: Physician Assistant

## 2013-06-19 ENCOUNTER — Encounter: Payer: Self-pay | Admitting: Physician Assistant

## 2013-06-19 VITALS — BP 137/78 | HR 85 | Ht 71.0 in | Wt 238.0 lb

## 2013-06-19 DIAGNOSIS — E785 Hyperlipidemia, unspecified: Secondary | ICD-10-CM

## 2013-06-19 DIAGNOSIS — I2510 Atherosclerotic heart disease of native coronary artery without angina pectoris: Secondary | ICD-10-CM

## 2013-06-19 DIAGNOSIS — I10 Essential (primary) hypertension: Secondary | ICD-10-CM | POA: Diagnosis not present

## 2013-06-19 DIAGNOSIS — I5032 Chronic diastolic (congestive) heart failure: Secondary | ICD-10-CM

## 2013-06-19 MED ORDER — METOPROLOL TARTRATE 50 MG PO TABS: 50.0000 mg | ORAL_TABLET | Freq: Two times a day (BID) | ORAL | Status: DC

## 2013-06-19 NOTE — Progress Notes (Signed)
Primary Cardiologist: Simona Huh, MD   HPI: Scheduled 2 week followup.  He returns today reporting no significant change from his baseline, although his daughter feels that he is doing "better". Specifically, he did have an episode of nocturnal angina 2 nights ago, similar to prior experiences, and again for which he took 1 NTG tablet with relief after approximately 20 minutes. He denies any exertional CP, however.   Regarding the issue of possible statin intolerance, he did stop Lipitor 2 weeks ago, as instructed, and seems to suggest decreased episodes of "cramping" at night, when lying in bed.  Allergies  Allergen Reactions  . Codeine Nausea And Vomiting    Current Outpatient Prescriptions  Medication Sig Dispense Refill  . amLODipine (NORVASC) 10 MG tablet Take 1 tablet (10 mg total) by mouth daily.  90 tablet  3  . aspirin (ASPIR-LOW) 81 MG EC tablet Take 81 mg by mouth daily.        . calcium carbonate (TUMS) 500 MG chewable tablet Chew 2 tablets by mouth daily.        . clopidogrel (PLAVIX) 75 MG tablet Take 1 tablet (75 mg total) by mouth daily.  90 tablet  3  . docusate sodium (COLACE) 100 MG capsule Take 200 mg by mouth daily.       . furosemide (LASIX) 40 MG tablet Take 40 mg by mouth as needed.       . hydrochlorothiazide (HYDRODIURIL) 25 MG tablet Take 0.5 tablets (12.5 mg total) by mouth daily.  45 tablet  3  . isosorbide mononitrate (IMDUR) 120 MG 24 hr tablet Take 2 tablets (240 mg total) by mouth daily.  60 tablet  6  . LORazepam (ATIVAN) 0.5 MG tablet Take 0.5 mg by mouth at bedtime.       . metoprolol tartrate (LOPRESSOR) 50 MG tablet Take 1 tablet (50 mg total) by mouth 2 (two) times daily.  60 tablet  6  . nitroGLYCERIN (NITROSTAT) 0.4 MG SL tablet Place 0.4 mg under the tongue every 5 (five) minutes as needed. For chest pain      . pantoprazole (PROTONIX) 40 MG tablet Take 40 mg by mouth daily.        Marland Kitchen triamcinolone cream (KENALOG) 0.1 % Apply 1 application  topically 2 (two) times daily as needed.       . VOLTAREN 1 % GEL Apply 2 g topically as needed.       . [DISCONTINUED] chlorthalidone (HYGROTON) 25 MG tablet Take 1 tablet (25 mg total) by mouth as needed.  30 tablet  0  . [DISCONTINUED] simvastatin (ZOCOR) 40 MG tablet Take 40 mg by mouth daily.         No current facility-administered medications for this visit.    Past Medical History  Diagnosis Date  . Hyperlipidemia   . Essential hypertension, benign   . Osteoarthritis   . Prostate cancer   . Symptomatic bradycardia     s/p PPM  . Coronary atherosclerosis of native coronary artery     Multivessel - 02/2012 cath: 3vd, svg-om1-2 known to be occluded, vg-R acute marginal - PDA - PL - looked good to AM & PD w/ occlusion of native PD. Graft ok to PL w/ 95% stenosis @ insertion to PL. Native PL 90% diffuse (unchanged from 2/11). Lima-LAD patent w L->R collats.  EF 55% - med rx   . NSTEMI (non-ST elevated myocardial infarction)   . GERD (gastroesophageal reflux disease)     Past Surgical  History  Procedure Laterality Date  . Coronary artery bypass graft    . Cholecystectomy    . Prostatectomy    . Ppm  6/11    History   Social History  . Marital Status: Married    Spouse Name: N/A    Number of Children: N/A  . Years of Education: N/A   Occupational History  . Not on file.   Social History Main Topics  . Smoking status: Never Smoker   . Smokeless tobacco: Never Used  . Alcohol Use: No  . Drug Use: No  . Sexually Active: No   Other Topics Concern  . Not on file   Social History Narrative   Retired and married.     Family History  Problem Relation Age of Onset  . Coronary artery disease Mother   . Coronary artery disease Father     ROS: no nausea, vomiting; no fever, chills; no melena, hematochezia; no claudication  PHYSICAL EXAM: BP 137/78  Pulse 85  Ht 5\' 11"  (1.803 m)  Wt 238 lb (107.956 kg)  BMI 33.21 kg/m2 GENERAL: 79 year-old male, obese; NAD   HEENT: NCAT, PERRLA, EOMI; sclera clear; no xanthelasma  NECK: palpable bilateral carotid pulses, no bruits; no JVD; no TM  LUNGS: CTA bilaterally  CARDIAC: RRR (S1, S2); no significant murmurs; no rubs or gallops  ABDOMEN: soft, protuberant  EXTREMETIES: no significant peripheral edema  SKIN: warm/dry; no obvious rash/lesions  MUSCULOSKELETAL: no joint deformity  NEURO: no focal deficit; NL affect    EKG:    ASSESSMENT & PLAN:  Coronary atherosclerosis of native coronary artery Will increase Lopressor to 50 mg twice a day, in an ongoing attempt to optimize his antianginal regimen. He is now at maximum dose of Imdur. We had discussed adding Ranexa in the past. We will continue to consider this option if he continues to have breakthrough angina, and were are unable to further uptitrate his beta blocker.  Essential hypertension, benign Stable on current medication regimen  Chronic diastolic heart failure No definite evidence of decompensated heart failure by history or exam. Patient has diuresed 3 pounds since last visit. Continue current diuretic regimen.  Dyslipidemia We'll continue to monitor off Lipitor, to see if his occasional calf pain completely resolves. It may be, however, that his occasional nocturnal leg cramping is unrelated to statin therapy.    Gene Francine Hannan, PAC

## 2013-06-19 NOTE — Assessment & Plan Note (Signed)
Will increase Lopressor to 50 mg twice a day, in an ongoing attempt to optimize his antianginal regimen. He is now at maximum dose of Imdur. We had discussed adding Ranexa in the past. We will continue to consider this option if he continues to have breakthrough angina, and were are unable to further uptitrate his beta blocker.

## 2013-06-19 NOTE — Assessment & Plan Note (Signed)
We'll continue to monitor off Lipitor, to see if his occasional calf pain completely resolves. It may be, however, that his occasional nocturnal leg cramping is unrelated to statin therapy.

## 2013-06-19 NOTE — Patient Instructions (Signed)
   Increase Lopressor to 50mg  twice a day  - new sent to pharm Continue all other current medications. Follow up in  2 months

## 2013-06-19 NOTE — Assessment & Plan Note (Signed)
Stable on current medication regimen 

## 2013-06-19 NOTE — Assessment & Plan Note (Signed)
No definite evidence of decompensated heart failure by history or exam. Patient has diuresed 3 pounds since last visit. Continue current diuretic regimen.

## 2013-08-01 ENCOUNTER — Ambulatory Visit (INDEPENDENT_AMBULATORY_CARE_PROVIDER_SITE_OTHER): Payer: Medicare Other | Admitting: *Deleted

## 2013-08-01 DIAGNOSIS — I498 Other specified cardiac arrhythmias: Secondary | ICD-10-CM | POA: Diagnosis not present

## 2013-08-01 LAB — PACEMAKER DEVICE OBSERVATION
AL THRESHOLD: 0.5 V
BATTERY VOLTAGE: 2.79 V
RV LEAD AMPLITUDE: 22.4 mv
RV LEAD IMPEDENCE PM: 425 Ohm
VENTRICULAR PACING PM: 0

## 2013-08-01 NOTE — Progress Notes (Signed)
Pacemaker check in clinic. Normal device function. Battery longevity 12 years. No episodes recorded on check. ROV in February with [facility].

## 2013-09-08 DIAGNOSIS — R300 Dysuria: Secondary | ICD-10-CM | POA: Diagnosis not present

## 2013-09-08 DIAGNOSIS — Z23 Encounter for immunization: Secondary | ICD-10-CM | POA: Diagnosis not present

## 2013-09-16 ENCOUNTER — Encounter: Payer: Self-pay | Admitting: Internal Medicine

## 2013-09-17 ENCOUNTER — Ambulatory Visit: Payer: Medicare Other | Admitting: Cardiology

## 2013-10-16 DIAGNOSIS — I209 Angina pectoris, unspecified: Secondary | ICD-10-CM | POA: Diagnosis not present

## 2013-10-16 DIAGNOSIS — R32 Unspecified urinary incontinence: Secondary | ICD-10-CM | POA: Diagnosis not present

## 2013-10-16 DIAGNOSIS — M129 Arthropathy, unspecified: Secondary | ICD-10-CM | POA: Diagnosis not present

## 2013-10-16 DIAGNOSIS — I10 Essential (primary) hypertension: Secondary | ICD-10-CM | POA: Diagnosis not present

## 2013-10-16 DIAGNOSIS — K219 Gastro-esophageal reflux disease without esophagitis: Secondary | ICD-10-CM | POA: Diagnosis not present

## 2013-10-16 DIAGNOSIS — Z23 Encounter for immunization: Secondary | ICD-10-CM | POA: Diagnosis not present

## 2013-10-22 DIAGNOSIS — R509 Fever, unspecified: Secondary | ICD-10-CM | POA: Diagnosis not present

## 2013-10-22 DIAGNOSIS — J40 Bronchitis, not specified as acute or chronic: Secondary | ICD-10-CM | POA: Diagnosis not present

## 2013-12-01 ENCOUNTER — Other Ambulatory Visit: Payer: Self-pay | Admitting: *Deleted

## 2013-12-01 MED ORDER — ISOSORBIDE MONONITRATE ER 120 MG PO TB24: 240.0000 mg | ORAL_TABLET | Freq: Every day | ORAL | Status: DC

## 2014-01-28 ENCOUNTER — Encounter: Payer: Self-pay | Admitting: *Deleted

## 2014-01-29 ENCOUNTER — Other Ambulatory Visit: Payer: Self-pay | Admitting: Cardiology

## 2014-01-29 MED ORDER — METOPROLOL TARTRATE 50 MG PO TABS: 50.0000 mg | ORAL_TABLET | Freq: Two times a day (BID) | ORAL | Status: DC

## 2014-02-27 ENCOUNTER — Other Ambulatory Visit: Payer: Self-pay | Admitting: Cardiology

## 2014-03-04 ENCOUNTER — Ambulatory Visit (INDEPENDENT_AMBULATORY_CARE_PROVIDER_SITE_OTHER): Payer: Medicare Other | Admitting: Internal Medicine

## 2014-03-04 ENCOUNTER — Encounter: Payer: Self-pay | Admitting: Internal Medicine

## 2014-03-04 VITALS — BP 129/64 | HR 64 | Ht 71.0 in | Wt 246.8 lb

## 2014-03-04 DIAGNOSIS — Z950 Presence of cardiac pacemaker: Secondary | ICD-10-CM

## 2014-03-04 DIAGNOSIS — I495 Sick sinus syndrome: Secondary | ICD-10-CM | POA: Diagnosis not present

## 2014-03-04 DIAGNOSIS — I10 Essential (primary) hypertension: Secondary | ICD-10-CM

## 2014-03-04 LAB — MDC_IDC_ENUM_SESS_TYPE_INCLINIC
Battery Impedance: 108 Ohm
Battery Remaining Longevity: 140 mo
Battery Voltage: 2.8 V
Lead Channel Pacing Threshold Amplitude: 1 V
Lead Channel Pacing Threshold Pulse Width: 0.4 ms
Lead Channel Setting Pacing Amplitude: 2 V
Lead Channel Setting Pacing Pulse Width: 0.4 ms
Lead Channel Setting Sensing Sensitivity: 5.6 mV
MDC IDC MSMT LEADCHNL RA IMPEDANCE VALUE: 545 Ohm
MDC IDC MSMT LEADCHNL RA PACING THRESHOLD AMPLITUDE: 1 V
MDC IDC MSMT LEADCHNL RA PACING THRESHOLD PULSEWIDTH: 0.4 ms
MDC IDC MSMT LEADCHNL RV IMPEDANCE VALUE: 428 Ohm
MDC IDC SESS DTM: 20150325141055
MDC IDC SET LEADCHNL RV PACING AMPLITUDE: 2.5 V
MDC IDC STAT BRADY AP VP PERCENT: 1 %
MDC IDC STAT BRADY AP VS PERCENT: 99 %
MDC IDC STAT BRADY AS VP PERCENT: 0 %
MDC IDC STAT BRADY AS VS PERCENT: 0 %

## 2014-03-04 NOTE — Progress Notes (Signed)
PCP:  Rory Percy, MD Primary Cardiologist:  Domenic Polite  The patient presents today for routine electrophysiology followup.  Since last being seen in our clinic, the patient reports doing very well.  He remains quite active despite his age.  Today, he denies symptoms of palpitations, chest pain, shortness of breath, dizziness, presyncope, syncope, or neurologic sequela.  His edema is improved. The patient feels that he is tolerating medications without difficulties and is otherwise without complaint today.   Past Medical History  Diagnosis Date  . Hyperlipidemia   . Essential hypertension, benign   . Osteoarthritis   . Prostate cancer   . Symptomatic bradycardia     s/p PPM  . Coronary atherosclerosis of native coronary artery     Multivessel - 02/2012 cath: 3vd, svg-om1-2 known to be occluded, vg-R acute marginal - PDA - PL - looked good to AM & PD w/ occlusion of native PD. Graft ok to PL w/ 95% stenosis @ insertion to PL. Native PL 90% diffuse (unchanged from 2/11). Lima-LAD patent w L->R collats.  EF 55% - med rx   . NSTEMI (non-ST elevated myocardial infarction)   . GERD (gastroesophageal reflux disease)    Past Surgical History  Procedure Laterality Date  . Coronary artery bypass graft    . Cholecystectomy    . Prostatectomy    . Ppm  6/11    Current Outpatient Prescriptions  Medication Sig Dispense Refill  . amLODipine (NORVASC) 10 MG tablet Take 1 tablet (10 mg total) by mouth daily.  90 tablet  3  . aspirin (ASPIR-LOW) 81 MG EC tablet Take 81 mg by mouth daily.        . clopidogrel (PLAVIX) 75 MG tablet Take 1 tablet (75 mg total) by mouth daily.  90 tablet  3  . docusate sodium (COLACE) 100 MG capsule Take 200 mg by mouth daily as needed.       . furosemide (LASIX) 40 MG tablet Take 40 mg by mouth as needed.       . hydrochlorothiazide (HYDRODIURIL) 25 MG tablet Take 0.5 tablets (12.5 mg total) by mouth daily.  45 tablet  3  . isosorbide mononitrate (IMDUR) 120 MG 24 hr  tablet TAKE 2 TABLETS BY MOUTH EVERY DAY  60 tablet  1  . LORazepam (ATIVAN) 0.5 MG tablet Take 0.5 mg by mouth at bedtime.       . metoprolol (LOPRESSOR) 50 MG tablet Take 1 tablet (50 mg total) by mouth 2 (two) times daily.  60 tablet  1  . nitroGLYCERIN (NITROSTAT) 0.4 MG SL tablet Place 0.4 mg under the tongue every 5 (five) minutes as needed. For chest pain      . pantoprazole (PROTONIX) 40 MG tablet Take 40 mg by mouth daily.        Marland Kitchen triamcinolone cream (KENALOG) 0.1 % Apply 1 application topically 2 (two) times daily as needed.       . VOLTAREN 1 % GEL Apply 2 g topically as needed.       . [DISCONTINUED] chlorthalidone (HYGROTON) 25 MG tablet Take 1 tablet (25 mg total) by mouth as needed.  30 tablet  0  . [DISCONTINUED] simvastatin (ZOCOR) 40 MG tablet Take 40 mg by mouth daily.         No current facility-administered medications for this visit.    Allergies  Allergen Reactions  . Codeine Nausea And Vomiting    History   Social History  . Marital Status: Married  Spouse Name: N/A    Number of Children: N/A  . Years of Education: N/A   Occupational History  . Not on file.   Social History Main Topics  . Smoking status: Never Smoker   . Smokeless tobacco: Never Used  . Alcohol Use: No  . Drug Use: No  . Sexual Activity: No   Other Topics Concern  . Not on file   Social History Narrative   Retired and married.     Family History  Problem Relation Age of Onset  . Coronary artery disease Mother   . Coronary artery disease Father    Physical Exam: Filed Vitals:   03/04/14 1251  BP: 129/64  Pulse: 64  Height: 5\' 11"  (1.803 m)  Weight: 246 lb 12.8 oz (111.948 kg)  SpO2: 91%    GEN- The patient is well appearing, alert and oriented x 3 today.   Head- normocephalic, atraumatic Eyes-  Sclera clear, conjunctiva pink Ears- hearing intact Oropharynx- clear Neck- supple, no JVP Lymph- no cervical lymphadenopathy Lungs- Clear to ausculation bilaterally,  normal work of breathing Chest- pacemaker pocket is well healed Heart- Regular rate and rhythm, no murmurs, rubs or gallops, PMI not laterally displaced GI- soft, NT, ND, + BS Extremities- no clubbing, cyanosis, or edema  Pacemaker interrogation- reviewed in detail today,  See PACEART report  Assessment and Plan:  1. Sinus node dysfunction Normal pacemaker function See Pace Art report No changes today  2. CAD No ischemic symptoms Continue medical therapy  Return to see Dr Domenic Polite as scheduled Return to the device clinic to see Erasmo Downer in 6 months I will see again in a year

## 2014-03-04 NOTE — Patient Instructions (Signed)
Your physician wants you to follow up in: 6 months.  You will receive a reminder letter in the mail one-two months in advance.  If you don't receive a letter, please call our office to schedule the follow up appointment - Erasmo Downer Your physician wants you to follow up in:  1 year.  You will receive a reminder letter in the mail one-two months in advance.  If you don't receive a letter, please call our office to schedule the follow up appointment - Dr. Rayann Heman Continue all other medications.

## 2014-03-27 ENCOUNTER — Other Ambulatory Visit: Payer: Self-pay | Admitting: Cardiology

## 2014-03-27 ENCOUNTER — Other Ambulatory Visit: Payer: Self-pay | Admitting: Physician Assistant

## 2014-04-07 ENCOUNTER — Ambulatory Visit (INDEPENDENT_AMBULATORY_CARE_PROVIDER_SITE_OTHER): Payer: Medicare Other | Admitting: Cardiology

## 2014-04-07 ENCOUNTER — Encounter: Payer: Self-pay | Admitting: Cardiology

## 2014-04-07 VITALS — BP 164/83 | HR 93 | Ht 71.0 in | Wt 244.0 lb

## 2014-04-07 DIAGNOSIS — I2510 Atherosclerotic heart disease of native coronary artery without angina pectoris: Secondary | ICD-10-CM | POA: Diagnosis not present

## 2014-04-07 DIAGNOSIS — E785 Hyperlipidemia, unspecified: Secondary | ICD-10-CM

## 2014-04-07 DIAGNOSIS — Z950 Presence of cardiac pacemaker: Secondary | ICD-10-CM | POA: Diagnosis not present

## 2014-04-07 DIAGNOSIS — I10 Essential (primary) hypertension: Secondary | ICD-10-CM

## 2014-04-07 NOTE — Patient Instructions (Signed)
Continue all current medications. Your physician wants you to follow up in: 6 months.  You will receive a reminder letter in the mail one-two months in advance.  If you don't receive a letter, please call our office to schedule the follow up appointment   

## 2014-04-07 NOTE — Progress Notes (Signed)
Clinical Summary Bradley Figueroa is an 80 y.o.male last seen in the office by Mr. Serpe PA-C in July 2014. He had a recent interval visit with Dr. Rayann Heman in March finding normal pacer function.  He is here with his daughter today. He does have stable angina symptoms, although these have been controlled reasonably well with medical therapy including as needed nitroglycerin. He does complain of leg edema, but has not been willing to take Lasix regularly. He also admits to salty meals such as country ham on a regular basis.  Echocardiogram in May 2012 demonstrated overall normal LV function with mild LVH, trace aortic regurgitation, mild mitral regurgitation, pacer wire in the right heart with trace tricuspid regurgitation.  Lab work from March 2014 showed cholesterol 120, HDL 43, LDL 57, triglycerides 100. He was taken off Lipitor by Mr. Serpe PA-C due to reported intolerances.   Allergies  Allergen Reactions  . Codeine Nausea And Vomiting    Current Outpatient Prescriptions  Medication Sig Dispense Refill  . amLODipine (NORVASC) 10 MG tablet TAKE 1 TABLET BY MOUTH EVERY DAY  90 tablet  0  . aspirin (ASPIR-LOW) 81 MG EC tablet Take 81 mg by mouth daily.        . clopidogrel (PLAVIX) 75 MG tablet TAKE 1 TABLET BY MOUTH EVERY DAY  90 tablet  0  . docusate sodium (COLACE) 100 MG capsule Take 200 mg by mouth daily as needed.       . furosemide (LASIX) 40 MG tablet Take 40 mg by mouth as needed.       . hydrochlorothiazide (HYDRODIURIL) 25 MG tablet TAKE 1/2 TABLET BY MOUTH EVERY DAY  45 tablet  0  . isosorbide mononitrate (IMDUR) 120 MG 24 hr tablet TAKE 2 TABLETS BY MOUTH EVERY DAY  60 tablet  1  . metoprolol (LOPRESSOR) 50 MG tablet TAKE 1 TABLET BY MOUTH TWICE DAILY --PT MUST CALL OFFICE TO SCHEDULE AN APPOINTMENT FOR FUTURE REFILLS  60 tablet  0  . nitroGLYCERIN (NITROSTAT) 0.4 MG SL tablet Place 0.4 mg under the tongue every 5 (five) minutes as needed. For chest pain      . pantoprazole  (PROTONIX) 40 MG tablet Take 40 mg by mouth daily.        Marland Kitchen triamcinolone cream (KENALOG) 0.1 % Apply 1 application topically 2 (two) times daily as needed.       . VOLTAREN 1 % GEL Apply 2 g topically as needed.       . [DISCONTINUED] chlorthalidone (HYGROTON) 25 MG tablet Take 1 tablet (25 mg total) by mouth as needed.  30 tablet  0  . [DISCONTINUED] simvastatin (ZOCOR) 40 MG tablet Take 40 mg by mouth daily.         No current facility-administered medications for this visit.    Past Medical History  Diagnosis Date  . Hyperlipidemia   . Essential hypertension, benign   . Osteoarthritis   . Prostate cancer   . Symptomatic bradycardia     s/p PPM  . Coronary atherosclerosis of native coronary artery     Multivessel - 02/2012 cath: 3vd, svg-om1-2 known to be occluded, vg-R acute marginal - PDA - PL - looked good to AM & PD w/ occlusion of native PD. Graft ok to PL w/ 95% stenosis @ insertion to PL. Native PL 90% diffuse (unchanged from 2/11). Lima-LAD patent w L->R collats.  EF 55% - med rx   . NSTEMI (non-ST elevated myocardial infarction)   .  GERD (gastroesophageal reflux disease)     Past Surgical History  Procedure Laterality Date  . Coronary artery bypass graft    . Cholecystectomy    . Prostatectomy    . Ppm  6/11    Social History Mr. Bastin reports that he has never smoked. He has never used smokeless tobacco. Mr. Savitz reports that he does not drink alcohol.  Review of Systems Hard of hearing. No palpitations or syncope. As outlined above.  Physical Examination Filed Vitals:   04/07/14 1338  BP: 164/83  Pulse: 93   Filed Weights   04/07/14 1338  Weight: 244 lb (110.678 kg)    Overweight male, appears comfortable.  HEENT: Conjunctiva and lids normal, oropharynx clear.  Neck: Supple, no elevated JVP or carotid bruits, no thyromegaly.  Lungs: Clear to auscultation, nonlabored breathing at rest.  Cardiac: Regular rate and rhythm, no S3, soft systolic murmur, no  pericardial rub.  Abdomen: Soft, nontender,bowel sounds present.  Extremities: 2+ edema, distal pulses 1-2+.  Skin: Warm and dry.  Musculoskeletal: No kyphosis.  Neuropsychiatric: Alert and oriented x3, affect grossly appropriate.   Problem List and Plan   Coronary atherosclerosis of native coronary artery Multivessel disease status post CABG with known graft disease as well. He has been managed medically with suboptimal revascularization options. Seems to be reasonably stable on medical therapy based on our discussion today. No changes were made in his regimen.  Essential hypertension, benign We discussed sodium restriction. He reports compliance with his medications.  Cardiac pacemaker in situ Keep follow up with Dr. Rayann Heman.  Dyslipidemia He has had intolerance to statins.    Satira Sark, M.D., F.A.C.C.

## 2014-04-07 NOTE — Assessment & Plan Note (Signed)
He has had intolerance to statins.

## 2014-04-07 NOTE — Assessment & Plan Note (Signed)
Multivessel disease status post CABG with known graft disease as well. He has been managed medically with suboptimal revascularization options. Seems to be reasonably stable on medical therapy based on our discussion today. No changes were made in his regimen.

## 2014-04-07 NOTE — Assessment & Plan Note (Signed)
We discussed sodium restriction. He reports compliance with his medications.

## 2014-04-07 NOTE — Assessment & Plan Note (Signed)
Keep followup with Dr. Allred. 

## 2014-04-15 DIAGNOSIS — R32 Unspecified urinary incontinence: Secondary | ICD-10-CM | POA: Diagnosis not present

## 2014-04-15 DIAGNOSIS — M129 Arthropathy, unspecified: Secondary | ICD-10-CM | POA: Diagnosis not present

## 2014-04-15 DIAGNOSIS — I209 Angina pectoris, unspecified: Secondary | ICD-10-CM | POA: Diagnosis not present

## 2014-04-15 DIAGNOSIS — I10 Essential (primary) hypertension: Secondary | ICD-10-CM | POA: Diagnosis not present

## 2014-04-15 DIAGNOSIS — N3090 Cystitis, unspecified without hematuria: Secondary | ICD-10-CM | POA: Diagnosis not present

## 2014-04-15 DIAGNOSIS — K219 Gastro-esophageal reflux disease without esophagitis: Secondary | ICD-10-CM | POA: Diagnosis not present

## 2014-04-28 ENCOUNTER — Other Ambulatory Visit: Payer: Self-pay | Admitting: Cardiology

## 2014-06-29 ENCOUNTER — Other Ambulatory Visit: Payer: Self-pay | Admitting: Cardiology

## 2014-06-29 ENCOUNTER — Other Ambulatory Visit: Payer: Self-pay | Admitting: *Deleted

## 2014-06-29 MED ORDER — AMLODIPINE BESYLATE 10 MG PO TABS: 10.0000 mg | ORAL_TABLET | Freq: Every day | ORAL | Status: DC

## 2014-06-29 MED ORDER — CLOPIDOGREL BISULFATE 75 MG PO TABS: 75.0000 mg | ORAL_TABLET | Freq: Once | ORAL | Status: DC

## 2014-08-28 DIAGNOSIS — Z23 Encounter for immunization: Secondary | ICD-10-CM | POA: Diagnosis not present

## 2014-09-17 DIAGNOSIS — Z23 Encounter for immunization: Secondary | ICD-10-CM | POA: Diagnosis not present

## 2014-09-18 ENCOUNTER — Encounter: Payer: Self-pay | Admitting: *Deleted

## 2014-09-25 DIAGNOSIS — M47816 Spondylosis without myelopathy or radiculopathy, lumbar region: Secondary | ICD-10-CM | POA: Diagnosis not present

## 2014-09-25 DIAGNOSIS — M5441 Lumbago with sciatica, right side: Secondary | ICD-10-CM | POA: Diagnosis not present

## 2014-09-25 DIAGNOSIS — M9903 Segmental and somatic dysfunction of lumbar region: Secondary | ICD-10-CM | POA: Diagnosis not present

## 2014-09-28 DIAGNOSIS — M5441 Lumbago with sciatica, right side: Secondary | ICD-10-CM | POA: Diagnosis not present

## 2014-09-28 DIAGNOSIS — M47816 Spondylosis without myelopathy or radiculopathy, lumbar region: Secondary | ICD-10-CM | POA: Diagnosis not present

## 2014-09-28 DIAGNOSIS — M9903 Segmental and somatic dysfunction of lumbar region: Secondary | ICD-10-CM | POA: Diagnosis not present

## 2014-10-01 DIAGNOSIS — M9903 Segmental and somatic dysfunction of lumbar region: Secondary | ICD-10-CM | POA: Diagnosis not present

## 2014-10-01 DIAGNOSIS — M5441 Lumbago with sciatica, right side: Secondary | ICD-10-CM | POA: Diagnosis not present

## 2014-10-01 DIAGNOSIS — M47816 Spondylosis without myelopathy or radiculopathy, lumbar region: Secondary | ICD-10-CM | POA: Diagnosis not present

## 2014-10-15 ENCOUNTER — Encounter: Payer: Self-pay | Admitting: Cardiology

## 2014-10-15 ENCOUNTER — Ambulatory Visit (INDEPENDENT_AMBULATORY_CARE_PROVIDER_SITE_OTHER): Payer: Medicare Other | Admitting: Cardiology

## 2014-10-15 VITALS — BP 127/69 | HR 64 | Ht 70.0 in | Wt 234.0 lb

## 2014-10-15 DIAGNOSIS — Z950 Presence of cardiac pacemaker: Secondary | ICD-10-CM

## 2014-10-15 DIAGNOSIS — I10 Essential (primary) hypertension: Secondary | ICD-10-CM | POA: Diagnosis not present

## 2014-10-15 DIAGNOSIS — I2510 Atherosclerotic heart disease of native coronary artery without angina pectoris: Secondary | ICD-10-CM

## 2014-10-15 DIAGNOSIS — I5032 Chronic diastolic (congestive) heart failure: Secondary | ICD-10-CM | POA: Diagnosis not present

## 2014-10-15 NOTE — Patient Instructions (Signed)

## 2014-10-15 NOTE — Progress Notes (Signed)
Reason for visit: CAD, hypertension  Clinical Summary Bradley Figueroa is an 80 y.o.male last seen in April. He is here with his daughter today. He reports no accelerating angina symptoms, has chronic stable dyspnea on exertion. He states that he has been compliant with his medications, outlined below. Denies any major bleeding episodes on Plavix. He is not particularly active, goes out to eat with some friends on a regular basis. He uses a cane, denies any recent falls.  Echocardiogram in May 2012 demonstrated overall normal LV function with mild LVH, trace aortic regurgitation, mild mitral regurgitation, pacer wire in the right heart with trace tricuspid regurgitation.  Lipid panel from last year showed cholesterol 120, triglycerides 100, HDL 43, and LDL 57. Routine lab work with Dr. Nadara Mustard.   Allergies  Allergen Reactions  . Codeine Nausea And Vomiting    Current Outpatient Prescriptions  Medication Sig Dispense Refill  . amLODipine (NORVASC) 10 MG tablet Take 1 tablet (10 mg total) by mouth daily. 90 tablet 3  . aspirin (ASPIR-LOW) 81 MG EC tablet Take 81 mg by mouth daily.      . clopidogrel (PLAVIX) 75 MG tablet Take 1 tablet (75 mg total) by mouth once. 90 tablet 3  . docusate sodium (COLACE) 100 MG capsule Take 200 mg by mouth daily as needed.     . furosemide (LASIX) 40 MG tablet Take 40 mg by mouth as needed.     . hydrochlorothiazide (HYDRODIURIL) 25 MG tablet TAKE 1/2 TABLET BY MOUTH EVERY DAY 30 tablet 6  . isosorbide mononitrate (IMDUR) 120 MG 24 hr tablet TAKE 2 TABLETS BY MOUTH EVERY DAY 60 tablet 6  . metoprolol (LOPRESSOR) 50 MG tablet Take 1 tablet (50 mg total) by mouth 2 (two) times daily. 60 tablet 6  . nitroGLYCERIN (NITROSTAT) 0.4 MG SL tablet Place 0.4 mg under the tongue every 5 (five) minutes as needed. For chest pain    . pantoprazole (PROTONIX) 40 MG tablet Take 40 mg by mouth daily.      Marland Kitchen triamcinolone cream (KENALOG) 0.1 % Apply 1 application topically 2 (two)  times daily as needed.     . VOLTAREN 1 % GEL Apply 2 g topically as needed.     . [DISCONTINUED] chlorthalidone (HYGROTON) 25 MG tablet Take 1 tablet (25 mg total) by mouth as needed. 30 tablet 0  . [DISCONTINUED] simvastatin (ZOCOR) 40 MG tablet Take 40 mg by mouth daily.       No current facility-administered medications for this visit.    Past Medical History  Diagnosis Date  . Hyperlipidemia   . Essential hypertension, benign   . Osteoarthritis   . Prostate cancer   . Symptomatic bradycardia     s/p PPM  . Coronary atherosclerosis of native coronary artery     Multivessel - 02/2012 cath: 3vd, svg-om1-2 known to be occluded, vg-R acute marginal - PDA - PL - looked good to AM & PD w/ occlusion of native PD. Graft ok to PL w/ 95% stenosis @ insertion to PL. Native PL 90% diffuse (unchanged from 2/11). Lima-LAD patent w L->R collats.  EF 55% - med rx   . NSTEMI (non-ST elevated myocardial infarction)   . GERD (gastroesophageal reflux disease)     Past Surgical History  Procedure Laterality Date  . Coronary artery bypass graft    . Cholecystectomy    . Prostatectomy    . Ppm  6/11    Social History Bradley Figueroa reports that he has  never smoked. He has never used smokeless tobacco. Bradley Figueroa reports that he does not drink alcohol.  Review of Systems Complete review of systems negative except as otherwise outlined in the clinical summary.  Physical Examination Filed Vitals:   10/15/14 1534  BP: 127/69  Pulse: 64   Filed Weights   10/15/14 1534  Weight: 234 lb (106.142 kg)    Overweight male, appears comfortable.  HEENT: Conjunctiva and lids normal, oropharynx clear.  Neck: Supple, no elevated JVP or carotid bruits, no thyromegaly.  Lungs: Clear to auscultation, nonlabored breathing at rest.  Cardiac: Regular rate and rhythm, no S3, soft systolic murmur, no pericardial rub.  Abdomen: Soft, nontender,bowel sounds present.  Extremities: 2+ edema, distal pulses 1-2+.     Problem List and Plan   Coronary atherosclerosis of native coronary artery Symptomatically stable at this time. Plan is to continue medical therapy and observation. He prefers a fairly conservative approach overall at this point.  Essential hypertension, benign Blood pressure is normal today. No changes were made in regimen.  Cardiac pacemaker in situ Atrial paced rhythm by ECG. Keep follow-up in the device clinic.    Satira Sark, M.D., F.A.C.C.

## 2014-10-15 NOTE — Assessment & Plan Note (Signed)
Symptomatically stable at this time. Plan is to continue medical therapy and observation. He prefers a fairly conservative approach overall at this point.

## 2014-10-15 NOTE — Assessment & Plan Note (Signed)
Atrial paced rhythm by ECG. Keep follow-up in the device clinic.

## 2014-10-15 NOTE — Assessment & Plan Note (Signed)
Blood pressure is normal today. No changes were made in regimen.

## 2014-10-20 DIAGNOSIS — E559 Vitamin D deficiency, unspecified: Secondary | ICD-10-CM | POA: Diagnosis not present

## 2014-10-20 DIAGNOSIS — M1990 Unspecified osteoarthritis, unspecified site: Secondary | ICD-10-CM | POA: Diagnosis not present

## 2014-10-20 DIAGNOSIS — I10 Essential (primary) hypertension: Secondary | ICD-10-CM | POA: Diagnosis not present

## 2014-10-20 DIAGNOSIS — K219 Gastro-esophageal reflux disease without esophagitis: Secondary | ICD-10-CM | POA: Diagnosis not present

## 2014-10-20 DIAGNOSIS — L570 Actinic keratosis: Secondary | ICD-10-CM | POA: Diagnosis not present

## 2014-10-20 DIAGNOSIS — R32 Unspecified urinary incontinence: Secondary | ICD-10-CM | POA: Diagnosis not present

## 2014-10-23 ENCOUNTER — Ambulatory Visit (INDEPENDENT_AMBULATORY_CARE_PROVIDER_SITE_OTHER): Payer: Medicare Other | Admitting: *Deleted

## 2014-10-23 DIAGNOSIS — I495 Sick sinus syndrome: Secondary | ICD-10-CM

## 2014-10-23 LAB — MDC_IDC_ENUM_SESS_TYPE_INCLINIC
Brady Statistic AP VP Percent: 0.7 %
Brady Statistic AP VS Percent: 92.8 %
Brady Statistic AS VP Percent: 0.1 %
Brady Statistic AS VS Percent: 6.5 %
Lead Channel Impedance Value: 537 Ohm
Lead Channel Pacing Threshold Pulse Width: 0.4 ms
Lead Channel Pacing Threshold Pulse Width: 0.4 ms
Lead Channel Setting Pacing Amplitude: 2 V
Lead Channel Setting Pacing Amplitude: 2.5 V
Lead Channel Setting Pacing Pulse Width: 0.4 ms
Lead Channel Setting Sensing Sensitivity: 5.6 mV
MDC IDC MSMT LEADCHNL RA PACING THRESHOLD AMPLITUDE: 1 V
MDC IDC MSMT LEADCHNL RV IMPEDANCE VALUE: 415 Ohm
MDC IDC MSMT LEADCHNL RV PACING THRESHOLD AMPLITUDE: 1 V
MDC IDC MSMT LEADCHNL RV SENSING INTR AMPL: 15.68 mV

## 2014-10-23 NOTE — Progress Notes (Signed)
Pacemaker check in clinic. Normal device function. No mode switch episodes or ventricular high rate episodes. Normal device function. No changes made. ROV in 03-12-15 @ 1030 with JA/[place].

## 2014-11-04 ENCOUNTER — Encounter: Payer: Self-pay | Admitting: Internal Medicine

## 2014-11-19 ENCOUNTER — Encounter (HOSPITAL_COMMUNITY): Payer: Self-pay | Admitting: Cardiovascular Disease

## 2014-11-20 ENCOUNTER — Other Ambulatory Visit: Payer: Self-pay | Admitting: Cardiology

## 2015-01-12 DIAGNOSIS — M1711 Unilateral primary osteoarthritis, right knee: Secondary | ICD-10-CM | POA: Diagnosis not present

## 2015-01-12 DIAGNOSIS — M25461 Effusion, right knee: Secondary | ICD-10-CM | POA: Diagnosis not present

## 2015-01-12 DIAGNOSIS — Z9049 Acquired absence of other specified parts of digestive tract: Secondary | ICD-10-CM | POA: Diagnosis not present

## 2015-01-12 DIAGNOSIS — M25561 Pain in right knee: Secondary | ICD-10-CM | POA: Diagnosis not present

## 2015-01-12 DIAGNOSIS — Z8546 Personal history of malignant neoplasm of prostate: Secondary | ICD-10-CM | POA: Diagnosis not present

## 2015-01-12 DIAGNOSIS — Z950 Presence of cardiac pacemaker: Secondary | ICD-10-CM | POA: Diagnosis not present

## 2015-01-12 DIAGNOSIS — Z7951 Long term (current) use of inhaled steroids: Secondary | ICD-10-CM | POA: Diagnosis not present

## 2015-01-27 DIAGNOSIS — M25561 Pain in right knee: Secondary | ICD-10-CM | POA: Diagnosis not present

## 2015-01-27 DIAGNOSIS — M1711 Unilateral primary osteoarthritis, right knee: Secondary | ICD-10-CM | POA: Diagnosis not present

## 2015-03-12 ENCOUNTER — Encounter: Payer: Self-pay | Admitting: Internal Medicine

## 2015-03-12 ENCOUNTER — Ambulatory Visit (INDEPENDENT_AMBULATORY_CARE_PROVIDER_SITE_OTHER): Payer: Medicare Other | Admitting: Internal Medicine

## 2015-03-12 VITALS — BP 140/70 | HR 86 | Ht 70.0 in | Wt 254.0 lb

## 2015-03-12 DIAGNOSIS — I10 Essential (primary) hypertension: Secondary | ICD-10-CM | POA: Diagnosis not present

## 2015-03-12 DIAGNOSIS — I495 Sick sinus syndrome: Secondary | ICD-10-CM | POA: Diagnosis not present

## 2015-03-12 DIAGNOSIS — I480 Paroxysmal atrial fibrillation: Secondary | ICD-10-CM | POA: Diagnosis not present

## 2015-03-12 DIAGNOSIS — I2510 Atherosclerotic heart disease of native coronary artery without angina pectoris: Secondary | ICD-10-CM

## 2015-03-12 LAB — MDC_IDC_ENUM_SESS_TYPE_INCLINIC
Battery Impedance: 154 Ohm
Battery Voltage: 2.8 V
Brady Statistic AP VS Percent: 89 %
Brady Statistic AS VS Percent: 9 %
Lead Channel Pacing Threshold Amplitude: 1 V
Lead Channel Pacing Threshold Pulse Width: 0.4 ms
Lead Channel Pacing Threshold Pulse Width: 0.4 ms
Lead Channel Setting Pacing Amplitude: 2.25 V
Lead Channel Setting Pacing Pulse Width: 0.4 ms
Lead Channel Setting Sensing Sensitivity: 5.6 mV
MDC IDC MSMT BATTERY REMAINING LONGEVITY: 129 mo
MDC IDC MSMT LEADCHNL RA IMPEDANCE VALUE: 545 Ohm
MDC IDC MSMT LEADCHNL RA PACING THRESHOLD AMPLITUDE: 1 V
MDC IDC MSMT LEADCHNL RV IMPEDANCE VALUE: 432 Ohm
MDC IDC SESS DTM: 20160401111152
MDC IDC SET LEADCHNL RV PACING AMPLITUDE: 2.5 V
MDC IDC STAT BRADY AP VP PERCENT: 1 %
MDC IDC STAT BRADY AS VP PERCENT: 0 %

## 2015-03-12 NOTE — Patient Instructions (Signed)
Your physician recommends that you schedule a follow-up appointment in: 1 year with Dr. Rayann Heman. You will receive a reminder letter in the mail in about 10 months reminding you to call and schedule your appointment. If you don't receive this letter, please contact our office. [facility]link device check on 06/15/15. Your physician recommends that you continue on your current medications as directed. Please refer to the Current Medication list given to you today.

## 2015-03-12 NOTE — Progress Notes (Signed)
Electrophysiology Office Note   Date:  03/12/2015   ID:  Bradley Figueroa, DOB [birth year], MRN [id number]  PCP:  Rory Percy, MD  Cardiologist:  Dr Domenic Polite Primary Electrophysiologist: Thompson Grayer, MD    Chief Complaint  Patient presents with  . Shortness of Breath     History of Present Illness: Bradley Figueroa is a 81 y.o. male who presents today for electrophysiology evaluation.   He is doing well for his age.  He has stable edema, and SOB.  He has no symptoms of afib.   Today, he denies symptoms of palpitations, chest pain,orthopnea, PND,  claudication, dizziness, presyncope, syncope, bleeding, or neurologic sequela. The patient is tolerating medications without difficulties and is otherwise without complaint today.    Past Medical History  Diagnosis Date  . Hyperlipidemia   . Essential hypertension, benign   . Osteoarthritis   . Prostate cancer   . Symptomatic bradycardia     s/p PPM  . Coronary atherosclerosis of native coronary artery     Multivessel - 02/2012 cath: 3vd, svg-om1-2 known to be occluded, vg-R acute marginal - PDA - PL - looked good to AM & PD w/ occlusion of native PD. Graft ok to PL w/ 95% stenosis @ insertion to PL. Native PL 90% diffuse (unchanged from 2/11). Lima-LAD patent w L->R collats.  EF 55% - med rx   . NSTEMI (non-ST elevated myocardial infarction)   . GERD (gastroesophageal reflux disease)   . Paroxysmal atrial fibrillation     found on PPM interrogation 12/15/14 (2.5 min episode), chads2vasc score is at least 4, poor candidate for anticoagulation   Past Surgical History  Procedure Laterality Date  . Coronary artery bypass graft    . Cholecystectomy    . Prostatectomy    . Ppm  6/11  . Left heart catheterization with coronary/graft angiogram N/A 02/15/2012    Procedure: LEFT HEART CATHETERIZATION WITH [name];  Surgeon: [name], MD;  Location: [facility] CATH LAB;  Service: Cardiovascular;  Laterality: N/A;      Current Outpatient Prescriptions  Medication Sig Dispense Refill  . amLODipine (NORVASC) 10 MG tablet Take 1 tablet (10 mg total) by mouth daily. 90 tablet 3  . aspirin (ASPIR-LOW) 81 MG EC tablet Take 81 mg by mouth daily.      . clopidogrel (PLAVIX) 75 MG tablet Take 1 tablet (75 mg total) by mouth once. 90 tablet 3  . docusate sodium (COLACE) 100 MG capsule Take 200 mg by mouth daily as needed.     . furosemide (LASIX) 40 MG tablet Take 40 mg by mouth as needed.     . hydrochlorothiazide (HYDRODIURIL) 25 MG tablet TAKE 1/2 TABLET BY MOUTH EVERY DAY 30 tablet 6  . isosorbide mononitrate (IMDUR) 120 MG 24 hr tablet TAKE 2 TABLETS BY MOUTH EVERY DAY 60 tablet 6  . metoprolol (LOPRESSOR) 50 MG tablet TAKE ONE TABLET BY MOUTH TWICE A DAY 60 tablet 6  . nitroGLYCERIN (NITROSTAT) 0.4 MG SL tablet Place 0.4 mg under the tongue every 5 (five) minutes as needed. For chest pain    . pantoprazole (PROTONIX) 40 MG tablet Take 40 mg by mouth daily.      Marland Kitchen triamcinolone cream (KENALOG) 0.1 % Apply 1 application topically 2 (two) times daily as needed.     . VOLTAREN 1 % GEL Apply 2 g topically as needed.     . [DISCONTINUED] chlorthalidone (HYGROTON) 25 MG tablet Take 1 tablet (25 mg total) by  mouth as needed. 30 tablet 0  . [DISCONTINUED] simvastatin (ZOCOR) 40 MG tablet Take 40 mg by mouth daily.       No current facility-administered medications for this visit.    Allergies:   Codeine   Social History:  The patient  reports that he has never smoked. He has never used smokeless tobacco. He reports that he does not drink alcohol or use illicit drugs.   Family History:  The patient's family history includes Coronary artery disease in his father and mother.    ROS:  Please see the history of present illness.   All other systems are reviewed and negative.    PHYSICAL EXAM: VS:  BP 140/70 mmHg  Pulse 86  Ht 5\' 10"  (1.778 m)  Wt 254 lb (115.214 kg)  BMI 36.45 kg/m2  SpO2 92% , BMI Body  mass index is 36.45 kg/(m^2). GEN: Well nourished, well developed, in no acute distress HEENT: normal Neck: no JVD, carotid bruits, or masses Cardiac: RRR; no murmurs, rubs, or gallops,no edema  Respiratory:  clear to auscultation bilaterally, normal work of breathing GI: soft, nontender, nondistended, + BS MS: no deformity or atrophy, walks slowly with a cane Skin: warm and dry, device pocket is well healed Neuro:  Strength and sensation are intact Psych: euthymic mood, full affect  Device interrogation is reviewed today in detail.  See PaceArt for details.   Lipid Panel     Component Value Date/Time   CHOL  01/28/2010 0442    180        ATP III CLASSIFICATION:  <200     mg/dL   Desirable  200-239  mg/dL   Borderline High  >=240    mg/dL   High          TRIG 59 01/28/2010 0442   HDL 49 01/28/2010 0442   CHOLHDL 3.7 01/28/2010 0442   VLDL 12 01/28/2010 0442   LDLCALC * 01/28/2010 0442    119        Total Cholesterol/HDL:CHD Risk Coronary Heart Disease Risk Table                     Men   Women  1/2 Average Risk   3.4   3.3  Average Risk       5.0   4.4  2 X Average Risk   9.6   7.1  3 X Average Risk  23.4   11.0        Use the calculated Patient Ratio above and the CHD Risk Table to determine the patient's CHD Risk.        ATP III CLASSIFICATION (LDL):  <100     mg/dL   Optimal  100-129  mg/dL   Near or Above                    Optimal  130-159  mg/dL   Borderline  160-189  mg/dL   High  >190     mg/dL   Very High     Wt Readings from Last 3 Encounters:  03/12/15 254 lb (115.214 kg)  10/15/14 234 lb (106.142 kg)  04/07/14 244 lb (110.678 kg)     ASSESSMENT AND PLAN:  1.  Paroxysmal atrial fibrillation chads2vasc score is at least 4.  Poor candidate for anticoagulation, afib burden is <.1%  2. Sick sinus syndrome Normal pacemaker function See Pace Art report No changes today  3. CAD No ischemic symptoms No changes today  4. HTN Stable No  change required today   Current medicines are reviewed at length with the patient today.   The patient does not have concerns regarding his medicines.  The following changes were made today:  none  Labs/ tests ordered today include:  Orders Placed This Encounter  Procedures  . Implantable device check    Follow-up: carelink, return to see me in 1 year  Signed, Thompson Grayer, MD  03/12/2015 11:20 AM     [facility] [address] [address] [phone] (office) [phone] (fax)

## 2015-05-05 DIAGNOSIS — M1711 Unilateral primary osteoarthritis, right knee: Secondary | ICD-10-CM | POA: Diagnosis not present

## 2015-05-17 DIAGNOSIS — I208 Other forms of angina pectoris: Secondary | ICD-10-CM | POA: Diagnosis not present

## 2015-05-17 DIAGNOSIS — N393 Stress incontinence (female) (male): Secondary | ICD-10-CM | POA: Diagnosis not present

## 2015-05-17 DIAGNOSIS — I10 Essential (primary) hypertension: Secondary | ICD-10-CM | POA: Diagnosis not present

## 2015-05-17 DIAGNOSIS — K219 Gastro-esophageal reflux disease without esophagitis: Secondary | ICD-10-CM | POA: Diagnosis not present

## 2015-05-17 DIAGNOSIS — Z1389 Encounter for screening for other disorder: Secondary | ICD-10-CM | POA: Diagnosis not present

## 2015-06-15 ENCOUNTER — Ambulatory Visit (INDEPENDENT_AMBULATORY_CARE_PROVIDER_SITE_OTHER): Payer: Medicare Other | Admitting: *Deleted

## 2015-06-15 DIAGNOSIS — I495 Sick sinus syndrome: Secondary | ICD-10-CM

## 2015-06-16 NOTE — Progress Notes (Signed)
Remote pacemaker transmission.   

## 2015-06-18 ENCOUNTER — Other Ambulatory Visit: Payer: Self-pay | Admitting: Cardiology

## 2015-06-25 LAB — CUP PACEART REMOTE DEVICE CHECK
Battery Impedance: 154 Ohm
Battery Voltage: 2.8 V
Brady Statistic AP VP Percent: 1 %
Brady Statistic AP VS Percent: 82 %
Brady Statistic AS VP Percent: 0 %
Date Time Interrogation Session: 20160705143022
Lead Channel Impedance Value: 552 Ohm
Lead Channel Pacing Threshold Amplitude: 1.125 V
Lead Channel Pacing Threshold Amplitude: 1.125 V
Lead Channel Pacing Threshold Pulse Width: 0.4 ms
Lead Channel Pacing Threshold Pulse Width: 0.4 ms
Lead Channel Sensing Intrinsic Amplitude: 11.2 mV
Lead Channel Setting Pacing Amplitude: 2.25 V
Lead Channel Setting Pacing Amplitude: 2.5 V
Lead Channel Setting Pacing Pulse Width: 0.4 ms
Lead Channel Setting Sensing Sensitivity: 5.6 mV
MDC IDC MSMT BATTERY REMAINING LONGEVITY: 131 mo
MDC IDC MSMT LEADCHNL RV IMPEDANCE VALUE: 427 Ohm
MDC IDC STAT BRADY AS VS PERCENT: 17 %

## 2015-07-16 ENCOUNTER — Encounter: Payer: Self-pay | Admitting: Cardiology

## 2015-07-16 ENCOUNTER — Encounter: Payer: Self-pay | Admitting: Internal Medicine

## 2015-07-19 ENCOUNTER — Other Ambulatory Visit: Payer: Self-pay | Admitting: Cardiology

## 2015-07-22 ENCOUNTER — Other Ambulatory Visit: Payer: Self-pay | Admitting: *Deleted

## 2015-07-22 MED ORDER — HYDROCHLOROTHIAZIDE 25 MG PO TABS: 12.5000 mg | ORAL_TABLET | Freq: Every day | ORAL | Status: DC

## 2015-09-16 ENCOUNTER — Ambulatory Visit (INDEPENDENT_AMBULATORY_CARE_PROVIDER_SITE_OTHER): Payer: Medicare Other | Admitting: *Deleted

## 2015-09-16 ENCOUNTER — Telehealth: Payer: Self-pay | Admitting: Cardiology

## 2015-09-16 ENCOUNTER — Other Ambulatory Visit: Payer: Self-pay | Admitting: Cardiology

## 2015-09-16 DIAGNOSIS — I495 Sick sinus syndrome: Secondary | ICD-10-CM

## 2015-09-16 NOTE — Telephone Encounter (Signed)
Confirmed remote transmission w/ pt daughter in law.   

## 2015-09-16 NOTE — Progress Notes (Signed)
Remote pacemaker transmission.   

## 2015-09-27 DIAGNOSIS — Z23 Encounter for immunization: Secondary | ICD-10-CM | POA: Diagnosis not present

## 2015-10-04 LAB — CUP PACEART REMOTE DEVICE CHECK
Battery Impedance: 155 Ohm
Battery Voltage: 2.8 V
Brady Statistic AP VS Percent: 80 %
Brady Statistic AS VP Percent: 0 %
Implantable Lead Implant Date: 20110621
Implantable Lead Location: 753859
Implantable Lead Location: 753860
Implantable Lead Model: 5076
Implantable Lead Model: 5076
Lead Channel Impedance Value: 412 Ohm
Lead Channel Impedance Value: 529 Ohm
Lead Channel Pacing Threshold Amplitude: 1.125 V
Lead Channel Pacing Threshold Pulse Width: 0.4 ms
Lead Channel Sensing Intrinsic Amplitude: 11.2 mV
Lead Channel Setting Pacing Amplitude: 2.25 V
Lead Channel Setting Pacing Amplitude: 2.5 V
Lead Channel Setting Pacing Pulse Width: 0.4 ms
Lead Channel Setting Sensing Sensitivity: 5.6 mV
MDC IDC LEAD IMPLANT DT: 20110621
MDC IDC MSMT BATTERY REMAINING LONGEVITY: 130 mo
MDC IDC MSMT LEADCHNL RA PACING THRESHOLD AMPLITUDE: 1.125 V
MDC IDC MSMT LEADCHNL RV PACING THRESHOLD PULSEWIDTH: 0.4 ms
MDC IDC SESS DTM: 20161006152329
MDC IDC STAT BRADY AP VP PERCENT: 1 %
MDC IDC STAT BRADY AS VS PERCENT: 20 %

## 2015-10-16 ENCOUNTER — Other Ambulatory Visit: Payer: Self-pay | Admitting: Cardiology

## 2015-10-21 ENCOUNTER — Encounter: Payer: Self-pay | Admitting: *Deleted

## 2015-11-11 DIAGNOSIS — K219 Gastro-esophageal reflux disease without esophagitis: Secondary | ICD-10-CM | POA: Diagnosis not present

## 2015-11-11 DIAGNOSIS — E559 Vitamin D deficiency, unspecified: Secondary | ICD-10-CM | POA: Diagnosis not present

## 2015-11-11 DIAGNOSIS — M1990 Unspecified osteoarthritis, unspecified site: Secondary | ICD-10-CM | POA: Diagnosis not present

## 2015-11-11 DIAGNOSIS — I10 Essential (primary) hypertension: Secondary | ICD-10-CM | POA: Diagnosis not present

## 2015-11-11 DIAGNOSIS — N393 Stress incontinence (female) (male): Secondary | ICD-10-CM | POA: Diagnosis not present

## 2015-11-12 ENCOUNTER — Encounter: Payer: Self-pay | Admitting: Internal Medicine

## 2015-11-18 DIAGNOSIS — I208 Other forms of angina pectoris: Secondary | ICD-10-CM | POA: Diagnosis not present

## 2015-11-18 DIAGNOSIS — K219 Gastro-esophageal reflux disease without esophagitis: Secondary | ICD-10-CM | POA: Diagnosis not present

## 2015-11-18 DIAGNOSIS — N393 Stress incontinence (female) (male): Secondary | ICD-10-CM | POA: Diagnosis not present

## 2015-11-18 DIAGNOSIS — I10 Essential (primary) hypertension: Secondary | ICD-10-CM | POA: Diagnosis not present

## 2015-11-25 DIAGNOSIS — I872 Venous insufficiency (chronic) (peripheral): Secondary | ICD-10-CM | POA: Diagnosis not present

## 2015-11-25 DIAGNOSIS — C44229 Squamous cell carcinoma of skin of left ear and external auricular canal: Secondary | ICD-10-CM | POA: Diagnosis not present

## 2015-12-16 ENCOUNTER — Telehealth: Payer: Self-pay | Admitting: Cardiology

## 2015-12-16 ENCOUNTER — Ambulatory Visit (INDEPENDENT_AMBULATORY_CARE_PROVIDER_SITE_OTHER): Payer: Medicare Other | Admitting: *Deleted

## 2015-12-16 DIAGNOSIS — I495 Sick sinus syndrome: Secondary | ICD-10-CM | POA: Diagnosis not present

## 2015-12-16 NOTE — Telephone Encounter (Signed)
Confirmed remote transmission w/ pt daughter.   

## 2015-12-17 ENCOUNTER — Encounter: Payer: Self-pay | Admitting: Cardiology

## 2015-12-17 LAB — CUP PACEART REMOTE DEVICE CHECK
Battery Impedance: 178 Ohm
Battery Voltage: 2.8 V
Brady Statistic AP VS Percent: 84 %
Brady Statistic AS VP Percent: 0 %
Date Time Interrogation Session: 20170106154032
Implantable Lead Implant Date: 20110621
Implantable Lead Implant Date: 20110621
Implantable Lead Location: 753859
Implantable Lead Location: 753860
Implantable Lead Model: 5076
Lead Channel Impedance Value: 437 Ohm
Lead Channel Setting Pacing Amplitude: 2 V
Lead Channel Setting Pacing Amplitude: 2.75 V
Lead Channel Setting Pacing Pulse Width: 0.4 ms
MDC IDC MSMT BATTERY REMAINING LONGEVITY: 135 mo
MDC IDC MSMT LEADCHNL RA IMPEDANCE VALUE: 535 Ohm
MDC IDC SET LEADCHNL RV SENSING SENSITIVITY: 5.6 mV
MDC IDC STAT BRADY AP VP PERCENT: 1 %
MDC IDC STAT BRADY AS VS PERCENT: 15 %

## 2015-12-27 NOTE — Progress Notes (Signed)
Remote pacemaker transmission.   

## 2015-12-31 ENCOUNTER — Encounter: Payer: Self-pay | Admitting: Cardiology

## 2016-01-06 DIAGNOSIS — Z08 Encounter for follow-up examination after completed treatment for malignant neoplasm: Secondary | ICD-10-CM | POA: Diagnosis not present

## 2016-01-06 DIAGNOSIS — Z85828 Personal history of other malignant neoplasm of skin: Secondary | ICD-10-CM | POA: Diagnosis not present

## 2016-01-06 DIAGNOSIS — L820 Inflamed seborrheic keratosis: Secondary | ICD-10-CM | POA: Diagnosis not present

## 2016-01-14 ENCOUNTER — Other Ambulatory Visit: Payer: Self-pay | Admitting: Cardiology

## 2016-02-02 DIAGNOSIS — M1711 Unilateral primary osteoarthritis, right knee: Secondary | ICD-10-CM | POA: Diagnosis not present

## 2016-02-02 DIAGNOSIS — M25561 Pain in right knee: Secondary | ICD-10-CM | POA: Diagnosis not present

## 2016-02-14 ENCOUNTER — Other Ambulatory Visit: Payer: Self-pay | Admitting: Cardiology

## 2016-02-14 ENCOUNTER — Other Ambulatory Visit: Payer: Self-pay | Admitting: *Deleted

## 2016-02-14 MED ORDER — HYDROCHLOROTHIAZIDE 25 MG PO TABS: ORAL_TABLET | ORAL | Status: DC

## 2016-02-14 MED ORDER — ISOSORBIDE MONONITRATE ER 120 MG PO TB24: 240.0000 mg | ORAL_TABLET | Freq: Every day | ORAL | Status: DC

## 2016-02-14 MED ORDER — CLOPIDOGREL BISULFATE 75 MG PO TABS: 75.0000 mg | ORAL_TABLET | Freq: Every day | ORAL | Status: DC

## 2016-02-14 MED ORDER — METOPROLOL TARTRATE 50 MG PO TABS: 50.0000 mg | ORAL_TABLET | Freq: Two times a day (BID) | ORAL | Status: DC

## 2016-03-24 ENCOUNTER — Encounter: Payer: Self-pay | Admitting: Cardiology

## 2016-03-24 ENCOUNTER — Ambulatory Visit (INDEPENDENT_AMBULATORY_CARE_PROVIDER_SITE_OTHER): Payer: Medicare Other | Admitting: Cardiology

## 2016-03-24 VITALS — BP 122/72 | HR 86 | Ht 70.0 in | Wt 243.0 lb

## 2016-03-24 DIAGNOSIS — I10 Essential (primary) hypertension: Secondary | ICD-10-CM

## 2016-03-24 DIAGNOSIS — I480 Paroxysmal atrial fibrillation: Secondary | ICD-10-CM

## 2016-03-24 DIAGNOSIS — I495 Sick sinus syndrome: Secondary | ICD-10-CM | POA: Diagnosis not present

## 2016-03-24 DIAGNOSIS — I25119 Atherosclerotic heart disease of native coronary artery with unspecified angina pectoris: Secondary | ICD-10-CM

## 2016-03-24 DIAGNOSIS — E785 Hyperlipidemia, unspecified: Secondary | ICD-10-CM

## 2016-03-24 NOTE — Patient Instructions (Signed)
Continue all current medications. Your physician wants you to follow up in:  1 year.  You will receive a reminder letter in the mail one-two months in advance.  If you don't receive a letter, please call our office to schedule the follow up appointment   

## 2016-03-24 NOTE — Progress Notes (Signed)
Cardiology Office Note  Date: 03/24/2016   ID: Bradley Figueroa, DOB 09/12/1922, MRN [id number]  PCP: Bradley Percy, MD  Primary Cardiologist: Bradley Lesches, MD   Chief Complaint  Patient presents with  . Coronary Artery Disease  . Diastolic heart failure    History of Present Illness: Bradley Figueroa is a 82 y.o. male last seen in November 2015. He presents overdue for a follow-up visit. He is here today with his daughter. Overall, he seems to be doing reasonably well with stable angina symptoms that respond to nitroglycerin. He uses a walker, has had no falls.  He continues with device interrogation per Dr. Rayann Figueroa. I reviewed his ECG today which shows an atrial paced rhythm.  We have been managing him conservatively with known multivessel CAD status post previous CABG. he has had no progression and angina symptoms on current regimen. He continues on aspirin, Plavix, Norvasc, Imdur, and Lopressor.  Past Medical History  Diagnosis Date  . Hyperlipidemia   . Essential hypertension   . Osteoarthritis   . Prostate cancer ([place])   . Symptomatic bradycardia     s/p PPM  . Coronary atherosclerosis of native coronary artery     Multivessel - 02/2012 cath: 3vd, svg-om1-2 known to be occluded, vg-R acute marginal - PDA - PL - looked good to AM & PD w/ occlusion of native PD. Graft ok to PL w/ 95% stenosis @ insertion to PL. Native PL 90% diffuse (unchanged from 2/11). Lima-LAD patent w L->R collats.  EF 55% - med rx   . NSTEMI (non-ST elevated myocardial infarction) ([place])   . GERD (gastroesophageal reflux disease)   . Paroxysmal atrial fibrillation ([facility])     Found on PPM interrogation 12/15/14 (2.5 min episode), chads2vasc score is at least 4, poor candidate for anticoagulation    Past Surgical History  Procedure Laterality Date  . Coronary artery bypass graft    . Cholecystectomy    . Prostatectomy    . Ppm  6/11  . Left heart catheterization with coronary/graft angiogram N/A  02/15/2012    Procedure: LEFT HEART CATHETERIZATION WITH [name];  Surgeon: Bradley Blanks, MD;  Location: [facility] CATH LAB;  Service: Cardiovascular;  Laterality: N/A;    Current Outpatient Prescriptions  Medication Sig Dispense Refill  . amLODipine (NORVASC) 10 MG tablet TAKE 1 TABLET BY MOUTH DAILY. 90 tablet 3  . aspirin (ASPIR-LOW) 81 MG EC tablet Take 81 mg by mouth daily.      . clopidogrel (PLAVIX) 75 MG tablet Take 1 tablet (75 mg total) by mouth daily. 90 tablet 3  . docusate sodium (COLACE) 100 MG capsule Take 200 mg by mouth daily as needed.     . furosemide (LASIX) 40 MG tablet Take 40 mg by mouth as needed.     . hydrochlorothiazide (HYDRODIURIL) 25 MG tablet TAKE 1/2 OF A TABLET BY MOUTH EVERY DAY 45 tablet 3  . isosorbide mononitrate (IMDUR) 120 MG 24 hr tablet Take 2 tablets (240 mg total) by mouth daily. 60 tablet 3  . metoprolol (LOPRESSOR) 50 MG tablet Take 1 tablet (50 mg total) by mouth 2 (two) times daily. 60 tablet 3  . nitroGLYCERIN (NITROSTAT) 0.4 MG SL tablet Place 0.4 mg under the tongue every 5 (five) minutes as needed. For chest pain    . pantoprazole (PROTONIX) 40 MG tablet Take 40 mg by mouth daily.      Marland Kitchen triamcinolone cream (KENALOG) 0.1 % Apply 1 application topically 2 (two)  times daily as needed.     . VOLTAREN 1 % GEL Apply 2 g topically as needed.     . [DISCONTINUED] chlorthalidone (HYGROTON) 25 MG tablet Take 1 tablet (25 mg total) by mouth as needed. 30 tablet 0  . [DISCONTINUED] simvastatin (ZOCOR) 40 MG tablet Take 40 mg by mouth daily.       No current facility-administered medications for this visit.   Allergies:  Codeine   Social History: The patient  reports that he has never smoked. He has never used smokeless tobacco. He reports that he does not drink alcohol or use illicit drugs.   ROS:  Please see the history of present illness. Otherwise, complete review of systems is positive for decreased hearing, arthritic pains.   All other systems are reviewed and negative.   Physical Exam: VS:  BP 122/72 mmHg  Pulse 86  Ht 5\' 10"  (1.778 m)  Wt 243 lb (110.224 kg)  BMI 34.87 kg/m2  SpO2 98%, BMI Body mass index is 34.87 kg/(m^2).  Wt Readings from Last 3 Encounters:  03/24/16 243 lb (110.224 kg)  03/12/15 254 lb (115.214 kg)  10/15/14 234 lb (106.142 kg)    Overweight male, appears comfortable.  HEENT: Conjunctiva and lids normal, oropharynx clear.  Neck: Supple, no elevated JVP or carotid bruits, no thyromegaly.  Lungs: Clear to auscultation, nonlabored breathing at rest.  Cardiac: Regular rate and rhythm, no S3, soft systolic murmur, no pericardial rub.  Abdomen: Soft, nontender,bowel sounds present.  Extremities: 1-2+ edema, distal pulses 1-2+.   ECG: I personally reviewed the prior tracing from 10/15/2014 which showed an atrial paced rhythm with IVCD and old inferior infarct pattern.  Recent Labwork:  March 2014: Cholesterol 120, triglycerides 100, HDL 43, LDL 57, AST 24, ALT 17  Other Studies Reviewed Today:  Echocardiogram 04/17/2011 [facility]): Mild LVH with LVEF 0000000, grade 1 diastolic dysfunction, device wire present in right heart, sclerotic aortic valve with trace aortic regurgitation, mild mitral regurgitation, trace tricuspid regurgitation.   Assessment and Plan:  1. Multivessel CAD status post CABG with known graft disease, being managed medically. He remains symptomatically stable on current medical regimen including high-dose nitrates. Follow-up ECG reviewed. Continue observation.  2. Sick sinus syndrome status post pacemaker placement, followed by Dr. Rayann Figueroa.  3. Paroxysmal atrial fibrillation noted by device interrogation, overall suboptimal candidate for anticoagulation. He remains on aspirin and Plavix in the setting of ischemic heart disease.  4. Essential hypertension, blood pressure is well controlled today.  5. Hyperlipidemia, no longer on statin therapy. Last LDL was  57.  Current medicines were reviewed with the patient today.   Orders Placed This Encounter  Procedures  . EKG 12-Lead    Disposition: FU with me in 1 year.   Signed, Bradley Sark, MD, [facility] 03/24/2016 11:05 AM    [facility] [place], [place], [place] [address] Phone: [phone]; Fax: [phone]

## 2016-05-11 DIAGNOSIS — I10 Essential (primary) hypertension: Secondary | ICD-10-CM | POA: Diagnosis not present

## 2016-05-11 DIAGNOSIS — M1990 Unspecified osteoarthritis, unspecified site: Secondary | ICD-10-CM | POA: Diagnosis not present

## 2016-05-11 DIAGNOSIS — K219 Gastro-esophageal reflux disease without esophagitis: Secondary | ICD-10-CM | POA: Diagnosis not present

## 2016-05-18 DIAGNOSIS — F419 Anxiety disorder, unspecified: Secondary | ICD-10-CM | POA: Diagnosis not present

## 2016-05-18 DIAGNOSIS — I10 Essential (primary) hypertension: Secondary | ICD-10-CM | POA: Diagnosis not present

## 2016-05-18 DIAGNOSIS — N393 Stress incontinence (female) (male): Secondary | ICD-10-CM | POA: Diagnosis not present

## 2016-06-12 ENCOUNTER — Other Ambulatory Visit: Payer: Self-pay | Admitting: Cardiology

## 2016-06-16 ENCOUNTER — Encounter: Payer: Medicare Other | Admitting: Internal Medicine

## 2016-06-21 DIAGNOSIS — M25561 Pain in right knee: Secondary | ICD-10-CM | POA: Diagnosis not present

## 2016-06-21 DIAGNOSIS — M1711 Unilateral primary osteoarthritis, right knee: Secondary | ICD-10-CM | POA: Diagnosis not present

## 2016-07-07 ENCOUNTER — Ambulatory Visit (INDEPENDENT_AMBULATORY_CARE_PROVIDER_SITE_OTHER): Payer: Medicare Other | Admitting: Internal Medicine

## 2016-07-07 ENCOUNTER — Encounter: Payer: Self-pay | Admitting: Internal Medicine

## 2016-07-07 DIAGNOSIS — I495 Sick sinus syndrome: Secondary | ICD-10-CM

## 2016-07-07 DIAGNOSIS — I480 Paroxysmal atrial fibrillation: Secondary | ICD-10-CM

## 2016-07-07 DIAGNOSIS — I25119 Atherosclerotic heart disease of native coronary artery with unspecified angina pectoris: Secondary | ICD-10-CM

## 2016-07-07 DIAGNOSIS — Z950 Presence of cardiac pacemaker: Secondary | ICD-10-CM | POA: Diagnosis not present

## 2016-07-07 LAB — CUP PACEART INCLINIC DEVICE CHECK
Brady Statistic AP VS Percent: 89 %
Brady Statistic AS VP Percent: 0 %
Brady Statistic AS VS Percent: 11 %
Date Time Interrogation Session: 20170728152620
Implantable Lead Location: 753859
Implantable Lead Model: 5076
Lead Channel Impedance Value: 424 Ohm
Lead Channel Pacing Threshold Amplitude: 1.25 V
Lead Channel Pacing Threshold Pulse Width: 0.4 ms
Lead Channel Pacing Threshold Pulse Width: 0.4 ms
Lead Channel Setting Pacing Amplitude: 2 V
Lead Channel Setting Sensing Sensitivity: 5.6 mV
MDC IDC LEAD IMPLANT DT: 20110621
MDC IDC LEAD IMPLANT DT: 20110621
MDC IDC LEAD LOCATION: 753860
MDC IDC MSMT BATTERY IMPEDANCE: 201 Ohm
MDC IDC MSMT BATTERY REMAINING LONGEVITY: 129 mo
MDC IDC MSMT BATTERY VOLTAGE: 2.8 V
MDC IDC MSMT LEADCHNL RA IMPEDANCE VALUE: 537 Ohm
MDC IDC MSMT LEADCHNL RA PACING THRESHOLD AMPLITUDE: 1 V
MDC IDC MSMT LEADCHNL RV SENSING INTR AMPL: 15.67 mV
MDC IDC SET LEADCHNL RV PACING AMPLITUDE: 2.75 V
MDC IDC SET LEADCHNL RV PACING PULSEWIDTH: 0.4 ms
MDC IDC STAT BRADY AP VP PERCENT: 1 %

## 2016-07-07 NOTE — Progress Notes (Signed)
Electrophysiology Office Note   Date:  07/07/2016   ID:  Bradley Figueroa, DOB 29-Jul-1922, MRN [id number]  PCP:  Bradley Percy, MD  Cardiologist:  Dr Domenic Polite Primary Electrophysiologist: Bradley Grayer, MD     History of Present Illness: Bradley Figueroa is a 82 y.o. male who presents today for electrophysiology evaluation.   He is doing well for his age.  He has occasional jaw pain for which he will take a sl NTG.  He declines additional workup or medicine changes.  He has no symptoms of afib.   Today, he denies symptoms of palpitations, chest pain,orthopnea, PND,  claudication, dizziness, presyncope, syncope, bleeding, or neurologic sequela. The patient is tolerating medications without difficulties and is otherwise without complaint today.    Past Medical History:  Diagnosis Date  . Coronary atherosclerosis of native coronary artery    Multivessel - 02/2012 cath: 3vd, svg-om1-2 known to be occluded, vg-R acute marginal - PDA - PL - looked good to AM & PD w/ occlusion of native PD. Graft ok to PL w/ 95% stenosis @ insertion to PL. Native PL 90% diffuse (unchanged from 2/11). Lima-LAD patent w L->R collats.  EF 55% - med rx   . Essential hypertension   . GERD (gastroesophageal reflux disease)   . Hyperlipidemia   . NSTEMI (non-ST elevated myocardial infarction) ([name])   . Osteoarthritis   . Paroxysmal atrial fibrillation (HCC)    Found on PPM interrogation 12/15/14 (2.5 min episode), chads2vasc score is at least 4, poor candidate for anticoagulation  . Prostate cancer ([name])   . Symptomatic bradycardia    s/p PPM   Past Surgical History:  Procedure Laterality Date  . CHOLECYSTECTOMY    . CORONARY ARTERY BYPASS GRAFT    . LEFT HEART CATHETERIZATION WITH CORONARY/GRAFT ANGIOGRAM N/A 02/15/2012   Procedure: LEFT HEART CATHETERIZATION WITH Bradley Figueroa;  Surgeon: [name], MD;  Location: [facility] CATH LAB;  Service: Cardiovascular;  Laterality: N/A;  . PPM  6/11  .  PROSTATECTOMY       Current Outpatient Prescriptions  Medication Sig Dispense Refill  . amLODipine (NORVASC) 10 MG tablet TAKE 1 TABLET BY MOUTH DAILY. 90 tablet 3  . aspirin (ASPIR-LOW) 81 MG EC tablet Take 81 mg by mouth daily.      . clopidogrel (PLAVIX) 75 MG tablet Take 1 tablet (75 mg total) by mouth daily. 90 tablet 3  . docusate sodium (COLACE) 100 MG capsule Take 200 mg by mouth daily as needed.     . furosemide (LASIX) 40 MG tablet Take 40 mg by mouth as needed.     . hydrochlorothiazide (HYDRODIURIL) 25 MG tablet TAKE 1/2 OF A TABLET BY MOUTH EVERY DAY 45 tablet 3  . isosorbide mononitrate (IMDUR) 120 MG 24 hr tablet TAKE 2 TABLETS BY MOUTH DAILY 60 tablet 3  . metoprolol (LOPRESSOR) 50 MG tablet TAKE 1 TABLET BY MOUTH TWICE DAILY 60 tablet 3  . nitroGLYCERIN (NITROSTAT) 0.4 MG SL tablet Place 0.4 mg under the tongue every 5 (five) minutes as needed. For chest pain    . pantoprazole (PROTONIX) 40 MG tablet Take 40 mg by mouth daily.      Marland Kitchen triamcinolone cream (KENALOG) 0.1 % Apply 1 application topically 2 (two) times daily as needed.     . VOLTAREN 1 % GEL Apply 2 g topically as needed.      No current facility-administered medications for this visit.     Allergies:   Codeine  Social History:  The patient  reports that he has never smoked. He has never used smokeless tobacco. He reports that he does not drink alcohol or use drugs.   Family History:  The patient's family history includes Coronary artery disease in his father and mother.    ROS:  Please see the history of present illness.   All other systems are reviewed and negative.    PHYSICAL EXAM: VS:  BP 128/69   Pulse 83   Ht 5\' 10"  (1.778 m)   Wt 239 lb 3.2 oz (108.5 kg)   SpO2 97%   BMI 34.32 kg/m  , BMI Body mass index is 34.32 kg/m. GEN: Well nourished, well developed, in no acute distress  HEENT: normal  Neck: no JVD, carotid bruits, or masses Cardiac: RRR; no murmurs, rubs, or gallops,no edema    Respiratory:  clear to auscultation bilaterally, normal work of breathing GI: soft, nontender, nondistended, + BS MS: no deformity or atrophy, walks slowly with a cane Skin: warm and dry, device pocket is well healed Neuro:  Strength and sensation are intact Psych: euthymic mood, full affect  Device interrogation is reviewed today in detail.  See PaceArt for details.   Lipid Panel     Component Value Date/Time   CHOL  01/28/2010 0442    180        ATP III CLASSIFICATION:  <200     mg/dL   Desirable  200-239  mg/dL   Borderline High  >=240    mg/dL   High          TRIG 59 01/28/2010 0442   HDL 49 01/28/2010 0442   CHOLHDL 3.7 01/28/2010 0442   VLDL 12 01/28/2010 0442   LDLCALC (H) 01/28/2010 0442    119        Total Cholesterol/HDL:CHD Risk Coronary Heart Disease Risk Table                     Men   Women  1/2 Average Risk   3.4   3.3  Average Risk       5.0   4.4  2 X Average Risk   9.6   7.1  3 X Average Risk  23.4   11.0        Use the calculated Patient Ratio above and the CHD Risk Table to determine the patient's CHD Risk.        ATP III CLASSIFICATION (LDL):  <100     mg/dL   Optimal  100-129  mg/dL   Near or Above                    Optimal  130-159  mg/dL   Borderline  160-189  mg/dL   High  >190     mg/dL   Very High     Wt Readings from Last 3 Encounters:  07/07/16 239 lb 3.2 oz (108.5 kg)  03/24/16 243 lb (110.2 kg)  03/12/15 254 lb (115.2 kg)     ASSESSMENT AND PLAN:  1.  Paroxysmal atrial fibrillation chads2vasc score is at least 4.  Poor candidate for anticoagulation, afib burden is <.1%  2. Sick sinus syndrome Normal pacemaker function See Pace Art report AV intervals reduced today due to PR of 360 msec (MVP turned off).  Will increase V pacing Daughter instructed to contact our office if he develops any worsening of symptoms.  3. CAD Occasional jaw pain Declines stress testing Has prn sl  NTG  4. HTN Stable No change required  today   Current medicines are reviewed at length with the patient today.   The patient does not have concerns regarding his medicines.  The following changes were made today:  none  Follow-up: carelink, return to see me in 1 year  Signed, Bradley Grayer, MD  07/07/2016 11:50 AM     [facility] [address] [place] [address] [phone] (office) [phone] (fax)

## 2016-07-07 NOTE — Patient Instructions (Addendum)
Medication Instructions:   Your physician recommends that you continue on your current medications as directed. Please refer to the Current Medication list given to you today.  Labwork: NONE  Testing/Procedures:  Device check from home on 10/09/16.  Follow-Up: Your physician recommends that you schedule a follow-up appointment in: 1 year with Dr. Rayann Heman. Please schedule this appointment today before leaving the office.  Any Other Special Instructions Will Be Listed Below (If Applicable).  If you need a refill on your cardiac medications before your next appointment, please call your pharmacy.

## 2016-07-12 ENCOUNTER — Other Ambulatory Visit: Payer: Self-pay | Admitting: Cardiology

## 2016-07-13 ENCOUNTER — Encounter: Payer: Self-pay | Admitting: Internal Medicine

## 2016-08-07 ENCOUNTER — Other Ambulatory Visit: Payer: Self-pay

## 2016-08-23 DIAGNOSIS — H538 Other visual disturbances: Secondary | ICD-10-CM | POA: Diagnosis not present

## 2016-08-23 DIAGNOSIS — H2513 Age-related nuclear cataract, bilateral: Secondary | ICD-10-CM | POA: Diagnosis not present

## 2016-10-03 DIAGNOSIS — Z23 Encounter for immunization: Secondary | ICD-10-CM | POA: Diagnosis not present

## 2016-10-09 ENCOUNTER — Ambulatory Visit (INDEPENDENT_AMBULATORY_CARE_PROVIDER_SITE_OTHER): Payer: Medicare Other | Admitting: *Deleted

## 2016-10-09 ENCOUNTER — Telehealth: Payer: Self-pay | Admitting: Cardiology

## 2016-10-09 DIAGNOSIS — I495 Sick sinus syndrome: Secondary | ICD-10-CM | POA: Diagnosis not present

## 2016-10-09 NOTE — Telephone Encounter (Signed)
Confirmed remote transmission w/ pt daughter.   

## 2016-10-10 ENCOUNTER — Other Ambulatory Visit: Payer: Self-pay | Admitting: Cardiology

## 2016-10-10 NOTE — Progress Notes (Signed)
Remote pacemaker transmission.   

## 2016-10-13 ENCOUNTER — Encounter: Payer: Self-pay | Admitting: Cardiology

## 2016-10-19 LAB — CUP PACEART REMOTE DEVICE CHECK
Battery Impedance: 249 Ohm
Battery Remaining Longevity: 90 mo
Battery Voltage: 2.79 V
Brady Statistic AP VP Percent: 96 %
Brady Statistic AS VS Percent: 1 %
Implantable Lead Implant Date: 20110621
Implantable Lead Location: 753860
Implantable Lead Model: 5076
Implantable Lead Model: 5076
Implantable Pulse Generator Implant Date: 20110621
Lead Channel Impedance Value: 421 Ohm
Lead Channel Impedance Value: 535 Ohm
Lead Channel Setting Pacing Amplitude: 2.25 V
Lead Channel Setting Pacing Amplitude: 2.75 V
Lead Channel Setting Pacing Pulse Width: 0.4 ms
MDC IDC LEAD IMPLANT DT: 20110621
MDC IDC LEAD LOCATION: 753859
MDC IDC MSMT LEADCHNL RA PACING THRESHOLD AMPLITUDE: 1.125 V
MDC IDC MSMT LEADCHNL RA PACING THRESHOLD PULSEWIDTH: 0.4 ms
MDC IDC MSMT LEADCHNL RV PACING THRESHOLD AMPLITUDE: 1.375 V
MDC IDC MSMT LEADCHNL RV PACING THRESHOLD PULSEWIDTH: 0.4 ms
MDC IDC SESS DTM: 20171030161529
MDC IDC SET LEADCHNL RV SENSING SENSITIVITY: 4 mV
MDC IDC STAT BRADY AP VS PERCENT: 0 %
MDC IDC STAT BRADY AS VP PERCENT: 3 %

## 2016-11-10 DIAGNOSIS — H5703 Miosis: Secondary | ICD-10-CM | POA: Diagnosis not present

## 2016-11-10 DIAGNOSIS — H25813 Combined forms of age-related cataract, bilateral: Secondary | ICD-10-CM | POA: Diagnosis not present

## 2016-11-27 DIAGNOSIS — H2512 Age-related nuclear cataract, left eye: Secondary | ICD-10-CM | POA: Diagnosis not present

## 2016-11-27 DIAGNOSIS — H2511 Age-related nuclear cataract, right eye: Secondary | ICD-10-CM | POA: Diagnosis not present

## 2016-11-27 DIAGNOSIS — H25011 Cortical age-related cataract, right eye: Secondary | ICD-10-CM | POA: Diagnosis not present

## 2016-12-08 DIAGNOSIS — R05 Cough: Secondary | ICD-10-CM | POA: Diagnosis not present

## 2016-12-08 DIAGNOSIS — J209 Acute bronchitis, unspecified: Secondary | ICD-10-CM | POA: Diagnosis not present

## 2016-12-08 DIAGNOSIS — Z6838 Body mass index (BMI) 38.0-38.9, adult: Secondary | ICD-10-CM | POA: Diagnosis not present

## 2016-12-20 DIAGNOSIS — M1990 Unspecified osteoarthritis, unspecified site: Secondary | ICD-10-CM | POA: Diagnosis not present

## 2016-12-20 DIAGNOSIS — K219 Gastro-esophageal reflux disease without esophagitis: Secondary | ICD-10-CM | POA: Diagnosis not present

## 2016-12-20 DIAGNOSIS — F419 Anxiety disorder, unspecified: Secondary | ICD-10-CM | POA: Diagnosis not present

## 2016-12-20 DIAGNOSIS — E559 Vitamin D deficiency, unspecified: Secondary | ICD-10-CM | POA: Diagnosis not present

## 2016-12-20 DIAGNOSIS — I10 Essential (primary) hypertension: Secondary | ICD-10-CM | POA: Diagnosis not present

## 2016-12-21 DIAGNOSIS — N393 Stress incontinence (female) (male): Secondary | ICD-10-CM | POA: Diagnosis not present

## 2016-12-21 DIAGNOSIS — Z6836 Body mass index (BMI) 36.0-36.9, adult: Secondary | ICD-10-CM | POA: Diagnosis not present

## 2016-12-21 DIAGNOSIS — I208 Other forms of angina pectoris: Secondary | ICD-10-CM | POA: Diagnosis not present

## 2016-12-21 DIAGNOSIS — I10 Essential (primary) hypertension: Secondary | ICD-10-CM | POA: Diagnosis not present

## 2016-12-21 DIAGNOSIS — M1990 Unspecified osteoarthritis, unspecified site: Secondary | ICD-10-CM | POA: Diagnosis not present

## 2017-01-08 ENCOUNTER — Ambulatory Visit (INDEPENDENT_AMBULATORY_CARE_PROVIDER_SITE_OTHER): Payer: Medicare Other | Admitting: *Deleted

## 2017-01-08 ENCOUNTER — Telehealth: Payer: Self-pay | Admitting: Cardiology

## 2017-01-08 DIAGNOSIS — I495 Sick sinus syndrome: Secondary | ICD-10-CM | POA: Diagnosis not present

## 2017-01-08 NOTE — Telephone Encounter (Signed)
Confirmed remote transmission w/ pt daughter.   

## 2017-01-09 ENCOUNTER — Encounter: Payer: Self-pay | Admitting: Cardiology

## 2017-01-09 NOTE — Progress Notes (Signed)
Remote pacemaker transmission.   

## 2017-01-11 LAB — CUP PACEART REMOTE DEVICE CHECK
Battery Remaining Longevity: 92 mo
Battery Voltage: 2.8 V
Brady Statistic AP VS Percent: 0 %
Brady Statistic AS VS Percent: 2 %
Date Time Interrogation Session: 20180130175357
Implantable Lead Location: 753859
Implantable Lead Model: 5076
Lead Channel Pacing Threshold Pulse Width: 0.4 ms
Lead Channel Pacing Threshold Pulse Width: 0.4 ms
Lead Channel Setting Pacing Amplitude: 2.25 V
Lead Channel Setting Pacing Pulse Width: 0.4 ms
Lead Channel Setting Sensing Sensitivity: 5.6 mV
MDC IDC LEAD IMPLANT DT: 20110621
MDC IDC LEAD IMPLANT DT: 20110621
MDC IDC LEAD LOCATION: 753860
MDC IDC MSMT BATTERY IMPEDANCE: 272 Ohm
MDC IDC MSMT LEADCHNL RA IMPEDANCE VALUE: 527 Ohm
MDC IDC MSMT LEADCHNL RA PACING THRESHOLD AMPLITUDE: 1.125 V
MDC IDC MSMT LEADCHNL RV IMPEDANCE VALUE: 416 Ohm
MDC IDC MSMT LEADCHNL RV PACING THRESHOLD AMPLITUDE: 1.125 V
MDC IDC PG IMPLANT DT: 20110621
MDC IDC SET LEADCHNL RV PACING AMPLITUDE: 2.5 V
MDC IDC STAT BRADY AP VP PERCENT: 94 %
MDC IDC STAT BRADY AS VP PERCENT: 4 %

## 2017-01-24 DIAGNOSIS — T1490XA Injury, unspecified, initial encounter: Secondary | ICD-10-CM | POA: Diagnosis not present

## 2017-02-07 ENCOUNTER — Other Ambulatory Visit: Payer: Self-pay | Admitting: Cardiology

## 2017-02-11 ENCOUNTER — Encounter (HOSPITAL_COMMUNITY): Payer: Self-pay | Admitting: *Deleted

## 2017-02-11 ENCOUNTER — Emergency Department (HOSPITAL_COMMUNITY)
Admission: EM | Admit: 2017-02-11 | Discharge: 2017-02-11 | Disposition: A | Discharge status: Home / Self Care | Payer: Medicare Other | Attending: Emergency Medicine | Admitting: Emergency Medicine

## 2017-02-11 ENCOUNTER — Emergency Department (HOSPITAL_COMMUNITY): Payer: Medicare Other

## 2017-02-11 DIAGNOSIS — M79604 Pain in right leg: Secondary | ICD-10-CM | POA: Diagnosis not present

## 2017-02-11 DIAGNOSIS — M25572 Pain in left ankle and joints of left foot: Secondary | ICD-10-CM | POA: Diagnosis not present

## 2017-02-11 DIAGNOSIS — M1712 Unilateral primary osteoarthritis, left knee: Secondary | ICD-10-CM | POA: Insufficient documentation

## 2017-02-11 DIAGNOSIS — M1711 Unilateral primary osteoarthritis, right knee: Secondary | ICD-10-CM | POA: Insufficient documentation

## 2017-02-11 DIAGNOSIS — R52 Pain, unspecified: Secondary | ICD-10-CM

## 2017-02-11 DIAGNOSIS — I2510 Atherosclerotic heart disease of native coronary artery without angina pectoris: Secondary | ICD-10-CM | POA: Insufficient documentation

## 2017-02-11 DIAGNOSIS — M79605 Pain in left leg: Secondary | ICD-10-CM | POA: Diagnosis not present

## 2017-02-11 DIAGNOSIS — I10 Essential (primary) hypertension: Secondary | ICD-10-CM | POA: Diagnosis not present

## 2017-02-11 DIAGNOSIS — M170 Bilateral primary osteoarthritis of knee: Secondary | ICD-10-CM

## 2017-02-11 DIAGNOSIS — I5032 Chronic diastolic (congestive) heart failure: Secondary | ICD-10-CM | POA: Diagnosis not present

## 2017-02-11 DIAGNOSIS — R404 Transient alteration of awareness: Secondary | ICD-10-CM | POA: Diagnosis not present

## 2017-02-11 DIAGNOSIS — Z79899 Other long term (current) drug therapy: Secondary | ICD-10-CM | POA: Insufficient documentation

## 2017-02-11 DIAGNOSIS — R531 Weakness: Secondary | ICD-10-CM | POA: Diagnosis not present

## 2017-02-11 DIAGNOSIS — I110 Hypertensive heart disease with heart failure: Secondary | ICD-10-CM | POA: Insufficient documentation

## 2017-02-11 DIAGNOSIS — M25562 Pain in left knee: Secondary | ICD-10-CM | POA: Diagnosis not present

## 2017-02-11 DIAGNOSIS — M25461 Effusion, right knee: Secondary | ICD-10-CM | POA: Diagnosis not present

## 2017-02-11 DIAGNOSIS — M25561 Pain in right knee: Secondary | ICD-10-CM | POA: Diagnosis present

## 2017-02-11 DIAGNOSIS — M7989 Other specified soft tissue disorders: Secondary | ICD-10-CM | POA: Diagnosis not present

## 2017-02-11 DIAGNOSIS — Z7982 Long term (current) use of aspirin: Secondary | ICD-10-CM | POA: Diagnosis not present

## 2017-02-11 LAB — CBC WITH DIFFERENTIAL/PLATELET
Basophils Absolute: 0 10*3/uL (ref 0.0–0.1)
Basophils Relative: 0 %
Eosinophils Absolute: 0.2 10*3/uL (ref 0.0–0.7)
Eosinophils Relative: 2 %
HEMATOCRIT: 41.6 % (ref 39.0–52.0)
Hemoglobin: 14.2 g/dL (ref 13.0–17.0)
LYMPHS ABS: 2.2 10*3/uL (ref 0.7–4.0)
Lymphocytes Relative: 20 %
MCH: 31.2 pg (ref 26.0–34.0)
MCHC: 34.1 g/dL (ref 30.0–36.0)
MCV: 91.4 fL (ref 78.0–100.0)
MONO ABS: 0.7 10*3/uL (ref 0.1–1.0)
Monocytes Relative: 6 %
Neutro Abs: 8.2 10*3/uL — ABNORMAL HIGH (ref 1.7–7.7)
Neutrophils Relative %: 72 %
PLATELETS: 171 10*3/uL (ref 150–400)
RBC: 4.55 MIL/uL (ref 4.22–5.81)
RDW: 14.2 % (ref 11.5–15.5)
WBC: 11.2 10*3/uL — ABNORMAL HIGH (ref 4.0–10.5)

## 2017-02-11 LAB — BASIC METABOLIC PANEL
Anion gap: 7 (ref 5–15)
BUN: 26 mg/dL — AB (ref 6–20)
CHLORIDE: 102 mmol/L (ref 101–111)
CO2: 27 mmol/L (ref 22–32)
Calcium: 9.8 mg/dL (ref 8.9–10.3)
Creatinine, Ser: 1.16 mg/dL (ref 0.61–1.24)
GFR calc Af Amer: >60 mL/min (ref >60)
GFR calc non Af Amer: 53 mL/min — ABNORMAL LOW (ref >60)
GLUCOSE: 114 mg/dL — AB (ref 65–99)
POTASSIUM: 4.4 mmol/L (ref 3.5–5.1)
Sodium: 136 mmol/L (ref 135–145)

## 2017-02-11 LAB — PROTIME-INR
INR: 1.02
Prothrombin Time: 13.4 seconds (ref 11.4–15.2)

## 2017-02-11 MED ORDER — KETOROLAC TROMETHAMINE 30 MG/ML IJ SOLN
30.0000 mg | Freq: Once | INTRAMUSCULAR | Status: AC
  Administered 2017-02-11: 30 mg via INTRAMUSCULAR
  Filled 2017-02-11: qty 1

## 2017-02-11 MED ORDER — DICLOFENAC SODIUM 1 % TD GEL: 4.0000 g | Freq: Four times a day (QID) | TRANSDERMAL | 0 refills | Status: AC

## 2017-02-11 NOTE — ED Provider Notes (Signed)
[facility] DEPT Provider Note   CSN: [id number] Arrival date & time: 02/11/17  0756     History   Chief Complaint Chief Complaint  Patient presents with  . Leg Pain    HPI Bradley Figueroa is a 83 y.o. male.  HPI   Bradley Figueroa is a 83 y.o. male with hx of symptomatic bradycardia, paroxysmal a fib, and CAD who presents to the Emergency Department complaining of bilateral lower extremity pain, weakness and swelling.  He states this is a chronic problem, but experienced increased pain and weakness this morning while bathing.  He describes a sharp, throbbing pain from both knees into the calf of his legs.  He also complains of intermittent swelling of the legs that is worse with standing.  He denies recent injury (although fell on his back 3 weeks ago), chest pain, shortness of breath, fever, chills and extremity numbness.  Past Medical History:  Diagnosis Date  . Coronary atherosclerosis of native coronary artery    Multivessel - 02/2012 cath: 3vd, svg-om1-2 known to be occluded, vg-R acute marginal - PDA - PL - looked good to AM & PD w/ occlusion of native PD. Graft ok to PL w/ 95% stenosis @ insertion to PL. Native PL 90% diffuse (unchanged from 2/11). [place]ima-LAD patent w L->R collats.  EF 55% - med rx   . Essential hypertension   . GERD (gastroesophageal reflux disease)   . Hyperlipidemia   . NSTEMI (non-ST elevated myocardial infarction) ([place])   . Osteoarthritis   . Paroxysmal atrial fibrillation (HCC)    Found on PPM interrogation 12/15/14 (2.5 min episode), chads2vasc score is at least 4, poor candidate for anticoagulation  . Prostate cancer ([place])   . Symptomatic bradycardia    s/p PPM    Patient Active Problem List   Diagnosis Date Noted  . Paroxysmal atrial fibrillation ([place]) 03/12/2015  . Sinus node dysfunction ([place]) 03/04/2014  . Cardiac pacemaker in situ 03/30/2013  . Dyslipidemia 02/23/2012  . Chronic diastolic heart failure ([place]) 03/24/2010  . Essential  hypertension, benign 02/15/2010  . Coronary atherosclerosis of native coronary artery 02/15/2010  . PVD 02/15/2010    Past Surgical History:  Procedure Laterality Date  . CHOLECYSTECTOMY    . CORONARY ARTERY BYPASS GRAFT    . LEFT HEART CATHETERIZATION WITH CORONARY/GRAFT ANGIOGRAM N/A 02/15/2012   Procedure: LEFT HEART CATHETERIZATION WITH [name];  Surgeon: [name], MD;  Location: [facility] CATH LAB;  Service: Cardiovascular;  Laterality: N/A;  . PPM  6/11  . PROSTATECTOMY         Home Medications    Prior to Admission medications   Medication Sig Start Date End Date Taking? Authorizing Provider  amLODipine (NORVASC) 10 MG tablet TAKE 1 TABLET BY MOUTH EVERY DAY --- NEEDS OFFICE VISIT WITH MCDOWELL 07/12/16   Satira Sark, MD  aspirin (ASPIR-LOW) 81 MG EC tablet Take 81 mg by mouth daily.      Historical Provider, MD  clopidogrel (PLAVIX) 75 MG tablet TAKE 1 TABLET BY MOUTH DAILY 02/07/17   Satira Sark, MD  docusate sodium (COLACE) 100 MG capsule Take 200 mg by mouth daily as needed.     Historical Provider, MD  furosemide (LASIX) 40 MG tablet Take 40 mg by mouth as needed.  05/24/13   Historical Provider, MD  hydrochlorothiazide (HYDRODIURIL) 25 MG tablet TAKE 1/2 TABLET BY MOUTH DAILY 02/07/17   Satira Sark, MD  isosorbide mononitrate (IMDUR) 120 MG 24 hr tablet TAKE  2 TABLETS BY MOUTH DAILY 10/10/16   Herminio Commons, MD  metoprolol (LOPRESSOR) 50 MG tablet TAKE 1 TABLET BY MOUTH TWICE DAILY 10/10/16   Herminio Commons, MD  nitroGLYCERIN (NITROSTAT) 0.4 MG SL tablet Place 0.4 mg under the tongue every 5 (five) minutes as needed. For chest pain    Historical Provider, MD  pantoprazole (PROTONIX) 40 MG tablet Take 40 mg by mouth daily.      Historical Provider, MD  triamcinolone cream (KENALOG) 0.1 % Apply 1 application topically 2 (two) times daily as needed.  01/09/13   Historical Provider, MD  VOLTAREN 1 % GEL Apply 2 g topically as  needed.  06/18/13   Historical Provider, MD    Family History Family History  Problem Relation Age of Onset  . Coronary artery disease Mother   . Coronary artery disease Father     Social History Social History  Substance Use Topics  . Smoking status: Never Smoker  . Smokeless tobacco: Never Used  . Alcohol use No     Allergies   Codeine   Review of Systems Review of Systems  Constitutional: Negative for chills and fever.  Respiratory: Negative for chest tightness and shortness of breath.   Cardiovascular: Negative for chest pain.  Gastrointestinal: Negative for abdominal pain, nausea and vomiting.  Genitourinary: Negative for difficulty urinating and dysuria.  Musculoskeletal: Positive for arthralgias and joint swelling.  Skin: Negative for color change and wound.  Neurological: Positive for weakness (weakness bilateral lower legs). Negative for dizziness and numbness.  All other systems reviewed and are negative.    Physical Exam Updated Vital Signs BP 145/64   Pulse 62   Temp 97.5 F (36.4 C) (Oral)   Resp 16   Ht 5\' 11"  (1.803 m)   Wt 108.4 kg   SpO2 95%   BMI 33.33 kg/m   Physical Exam  Constitutional: He is oriented to person, place, and time. He appears well-developed and well-nourished. No distress.  HENT:  Head: Atraumatic.  Mouth/Throat: Oropharynx is clear and moist.  Neck: Normal range of motion.  Cardiovascular: Normal rate, regular rhythm and intact distal pulses.   bilateral DP pulses brisk  Pulmonary/Chest: Effort normal and breath sounds normal. No respiratory distress.  Abdominal: Soft. He exhibits no distension. There is no tenderness. There is no guarding.  Musculoskeletal: Normal range of motion. He exhibits tenderness. He exhibits no edema or deformity.  Pain with ROM of the bilateral knees.  Posterior Lower leg is soft, non tender bilaterally  Neurological: He is alert and oriented to person, place, and time.  Skin: Skin is warm. No  rash noted.  Mild erythema of the anterior aspect of the bilateral LE's  Nursing note and vitals reviewed.    ED Treatments / Results  Labs (all labs ordered are listed, but only abnormal results are displayed) Labs Reviewed  BASIC METABOLIC PANEL  CBC WITH DIFFERENTIAL/PLATELET  PROTIME-INR    EKG  EKG Interpretation None       Radiology Dg Knee 1-2 Views Right  Result Date: 02/11/2017 CLINICAL DATA:  Left knee pain and weakness since this morning. EXAM: RIGHT KNEE - 1-2 VIEW COMPARISON:  None. FINDINGS: Mild tricompartmental degenerative changes. No acute fracture. No osteochondral lesion. Small joint effusion. Moderate vascular calcifications. IMPRESSION: Mild degenerative changes but no acute fracture. Small joint effusion. Electronically Signed   By: Marijo Sanes M.D.   On: 02/11/2017 10:04   Dg Knee 1-2 Views Right  Result Date: 02/11/2017 CLINICAL  DATA:  Pain and weakness.  No known injury. EXAM: RIGHT KNEE - 1-2 VIEW COMPARISON:  None. FINDINGS: Moderate tricompartmental degenerative changes with joint space narrowing and spurring. No acute fracture or osteochondral lesion. Small joint effusion. Moderate vascular calcifications. IMPRESSION: Tricompartmental degenerative changes but no acute bony abnormality. Small joint effusion. Electronically Signed   By: Marijo Sanes M.D.   On: 02/11/2017 10:03   Dg Ankle Complete Left  Result Date: 02/11/2017 CLINICAL DATA:  Left ankle pain and weakness since this morning. EXAM: LEFT ANKLE COMPLETE - 3+ VIEW COMPARISON:  None. FINDINGS: The ankle mortise is maintained. Mild degenerative changes. No acute fracture. No osteochondral lesion. No definite joint effusion. Mild subtalar degenerative changes. Calcaneal spurring. IMPRESSION: Degenerative changes but no acute findings. Electronically Signed   By: Marijo Sanes M.D.   On: 02/11/2017 10:05   US Venous Img Lower Bilateral  Result Date: 02/11/2017 CLINICAL DATA:  Bilateral lower  extremity pain and swelling. EXAM: BILATERAL LOWER EXTREMITY VENOUS DOPPLER ULTRASOUND TECHNIQUE: Gray-scale sonography with graded compression, as well as color Doppler and duplex ultrasound were performed to evaluate the lower extremity deep venous systems from the level of the common femoral vein and including the common femoral, femoral, profunda femoral, popliteal and calf veins including the posterior tibial, peroneal and gastrocnemius veins when visible. The superficial great saphenous vein was also interrogated. Spectral Doppler was utilized to evaluate flow at rest and with distal augmentation maneuvers in the common femoral, femoral and popliteal veins. COMPARISON:  None. FINDINGS: RIGHT LOWER EXTREMITY Common Femoral Vein: No evidence of thrombus. Normal compressibility, respiratory phasicity and response to augmentation. Saphenofemoral Junction: No evidence of thrombus. Normal compressibility and flow on color Doppler imaging. Profunda Femoral Vein: No evidence of thrombus. Normal compressibility and flow on color Doppler imaging. Femoral Vein: No evidence of thrombus. Normal compressibility, respiratory phasicity and response to augmentation. Popliteal Vein: No evidence of thrombus. Normal compressibility, respiratory phasicity and response to augmentation. Calf Veins: No evidence of thrombus. Normal compressibility and flow on color Doppler imaging. Superficial Great Saphenous Vein: No evidence of thrombus. Normal compressibility and flow on color Doppler imaging. Venous Reflux:  None. Other Findings:  None. LEFT LOWER EXTREMITY Common Femoral Vein: No evidence of thrombus. Normal compressibility, respiratory phasicity and response to augmentation. Saphenofemoral Junction: No evidence of thrombus. Normal compressibility and flow on color Doppler imaging. Profunda Femoral Vein: No evidence of thrombus. Normal compressibility and flow on color Doppler imaging. Femoral Vein: No evidence of thrombus.  Normal compressibility, respiratory phasicity and response to augmentation. Popliteal Vein: No evidence of thrombus. Normal compressibility, respiratory phasicity and response to augmentation. Calf Veins: No evidence of thrombus. Normal compressibility and flow on color Doppler imaging. Superficial Great Saphenous Vein: No evidence of thrombus. Normal compressibility and flow on color Doppler imaging. Venous Reflux:  None. Other Findings:  None. IMPRESSION: No evidence of deep venous thrombosis seen in either lower extremity. Electronically Signed   By: Marijo Conception, M.D.   On: 02/11/2017 11:54     Procedures Procedures (including critical care time)  Medications Ordered in ED Medications - No data to display   Initial Impression / Assessment and Plan / ED Course  I have reviewed the triage vital signs and the nursing notes.  Pertinent labs & imaging results that were available during my care of the patient were reviewed by me and considered in my medical decision making (see chart for details).     1000 Pt also seen by Dr. Sabra Heck and  care discussed.   Labs and [place] reassuring.  bilateral LE pain and weakness likely related to OA.  No focal neurological deficits.  Pt is feeling better after IM toradol.  Appears stable for d/c, pt and family agree to ortho f/u.  Return precautions discussed.    Final Clinical Impressions(s) / ED Diagnoses   Final diagnoses:  Osteoarthritis of both knees, unspecified osteoarthritis type    New Prescriptions Discharge Medication List as of 02/11/2017 12:31 PM       Coston Mandato Vanessa [place], PA-C 02/13/17 2233    Noemi Chapel, MD 02/16/17 1141

## 2017-02-11 NOTE — ED Triage Notes (Signed)
Pt brought in by [facility] pt c/o bilateral leg pain/weakness that has gotten worse over the last 4 hours. Pt was able to get up this morning and take a shower and then started having trouble with the pain/weakness in his legs and was unable to bear weight on legs. Pt reports he has chronic pain/weakness with his legs but not as bad as it was this morning. Equal strength on bilateral legs.

## 2017-02-11 NOTE — Discharge Instructions (Signed)
Follow-up with your orthopedic doctor for recheck

## 2017-02-11 NOTE — ED Provider Notes (Signed)
Medical screening examination/treatment/procedure(s) were conducted as a shared visit with non-physician practitioner(s) and myself.  I personally evaluated the patient during the encounter.  Clinical Impression:   Final diagnoses:  Osteoarthritis of both knees, unspecified osteoarthritis type      The patient is a 83 year old male, he has a history of chronic pain in his legs and in fact has had vein harvesting from his right lower extremity for a bypass operation that he had many years ago. 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 This does limit his ambulation and movement somewhat however over the last couple of days he has had more pain in his left leg. He feels that this is mostly behind the left knee, this morning he was up in the bathroom shaving and getting ready for the day when he had some difficulty ambulating secondary to that pain. He denies that it is weakness, denies fevers, denies swelling, denies changes in his skin including rashes warmth or redness. On exam the patient has a soft nontender abdomen, clear heart and lung sounds without significant murmurs, mild edema of the right lower extremity compared to the left lower extremity with well-healed scar from his vein harvest on the right medial leg. He has normal range of motion of the bilateral lower extremities with minimal pain, he can straight leg raise and has normal range of motion of the hip to lateral and medial rotation. He does have pain with loading weight onto his left leg. X-rays and ultrasound will be performed to rule out DVT and fractures or potentially severe arthritis that may be causing his pain. I do not see any signs of septic joint. The patient is otherwise well-appearing.   Noemi Chapel, MD 02/13/17 [phone]

## 2017-02-13 DIAGNOSIS — R29898 Other symptoms and signs involving the musculoskeletal system: Secondary | ICD-10-CM | POA: Diagnosis not present

## 2017-02-13 DIAGNOSIS — M170 Bilateral primary osteoarthritis of knee: Secondary | ICD-10-CM | POA: Diagnosis not present

## 2017-02-13 DIAGNOSIS — M25561 Pain in right knee: Secondary | ICD-10-CM | POA: Diagnosis not present

## 2017-02-13 DIAGNOSIS — M25562 Pain in left knee: Secondary | ICD-10-CM | POA: Diagnosis not present

## 2017-02-13 DIAGNOSIS — M5410 Radiculopathy, site unspecified: Secondary | ICD-10-CM | POA: Diagnosis not present

## 2017-04-10 ENCOUNTER — Telehealth: Payer: Self-pay | Admitting: Cardiology

## 2017-04-10 ENCOUNTER — Ambulatory Visit (INDEPENDENT_AMBULATORY_CARE_PROVIDER_SITE_OTHER): Payer: Medicare Other | Admitting: *Deleted

## 2017-04-10 DIAGNOSIS — I495 Sick sinus syndrome: Secondary | ICD-10-CM

## 2017-04-10 NOTE — Telephone Encounter (Signed)
LMOVM reminding pt to send remote transmission.   

## 2017-04-11 ENCOUNTER — Encounter: Payer: Self-pay | Admitting: Cardiology

## 2017-04-11 LAB — CUP PACEART REMOTE DEVICE CHECK
Brady Statistic AP VP Percent: 95 %
Brady Statistic AS VP Percent: 3 %
Brady Statistic AS VS Percent: 2 %
Implantable Lead Implant Date: 20110621
Implantable Lead Location: 753859
Implantable Lead Model: 5076
Implantable Lead Model: 5076
Lead Channel Impedance Value: 409 Ohm
Lead Channel Pacing Threshold Amplitude: 1.125 V
Lead Channel Pacing Threshold Pulse Width: 0.4 ms
Lead Channel Pacing Threshold Pulse Width: 0.4 ms
Lead Channel Setting Pacing Amplitude: 2.5 V
Lead Channel Setting Sensing Sensitivity: 5.6 mV
MDC IDC LEAD IMPLANT DT: 20110621
MDC IDC LEAD LOCATION: 753860
MDC IDC MSMT BATTERY IMPEDANCE: 296 Ohm
MDC IDC MSMT BATTERY REMAINING LONGEVITY: 89 mo
MDC IDC MSMT BATTERY VOLTAGE: 2.79 V
MDC IDC MSMT LEADCHNL RA IMPEDANCE VALUE: 506 Ohm
MDC IDC MSMT LEADCHNL RV PACING THRESHOLD AMPLITUDE: 1.125 V
MDC IDC PG IMPLANT DT: 20110621
MDC IDC SESS DTM: 20180501163315
MDC IDC SET LEADCHNL RA PACING AMPLITUDE: 2.25 V
MDC IDC SET LEADCHNL RV PACING PULSEWIDTH: 0.4 ms
MDC IDC STAT BRADY AP VS PERCENT: 0 %

## 2017-04-11 NOTE — Progress Notes (Signed)
Remote pacemaker transmission.   

## 2017-04-11 NOTE — Progress Notes (Signed)
Letter  

## 2017-05-06 ENCOUNTER — Other Ambulatory Visit: Payer: Self-pay | Admitting: Cardiovascular Disease

## 2017-06-13 ENCOUNTER — Other Ambulatory Visit: Payer: Self-pay | Admitting: Cardiovascular Disease

## 2017-06-14 DIAGNOSIS — K219 Gastro-esophageal reflux disease without esophagitis: Secondary | ICD-10-CM | POA: Diagnosis not present

## 2017-06-14 DIAGNOSIS — E559 Vitamin D deficiency, unspecified: Secondary | ICD-10-CM | POA: Diagnosis not present

## 2017-06-14 DIAGNOSIS — R5383 Other fatigue: Secondary | ICD-10-CM | POA: Diagnosis not present

## 2017-06-18 DIAGNOSIS — M1990 Unspecified osteoarthritis, unspecified site: Secondary | ICD-10-CM | POA: Diagnosis not present

## 2017-06-18 DIAGNOSIS — I208 Other forms of angina pectoris: Secondary | ICD-10-CM | POA: Diagnosis not present

## 2017-06-18 DIAGNOSIS — Z6836 Body mass index (BMI) 36.0-36.9, adult: Secondary | ICD-10-CM | POA: Diagnosis not present

## 2017-06-18 DIAGNOSIS — N393 Stress incontinence (female) (male): Secondary | ICD-10-CM | POA: Diagnosis not present

## 2017-06-18 DIAGNOSIS — I10 Essential (primary) hypertension: Secondary | ICD-10-CM | POA: Diagnosis not present

## 2017-07-03 ENCOUNTER — Other Ambulatory Visit: Payer: Self-pay | Admitting: Cardiology

## 2017-07-10 ENCOUNTER — Telehealth: Payer: Self-pay | Admitting: Cardiology

## 2017-07-10 ENCOUNTER — Encounter: Payer: Medicare Other | Admitting: *Deleted

## 2017-07-10 NOTE — Telephone Encounter (Signed)
LMOVM reminding pt to send remote transmission.   

## 2017-07-13 ENCOUNTER — Encounter: Payer: Self-pay | Admitting: Cardiology

## 2017-07-16 ENCOUNTER — Ambulatory Visit (INDEPENDENT_AMBULATORY_CARE_PROVIDER_SITE_OTHER): Payer: Medicare Other | Admitting: *Deleted

## 2017-07-16 DIAGNOSIS — I495 Sick sinus syndrome: Secondary | ICD-10-CM

## 2017-07-17 NOTE — Progress Notes (Signed)
Remote pacemaker transmission.   

## 2017-07-18 ENCOUNTER — Encounter: Payer: Self-pay | Admitting: Cardiology

## 2017-08-02 LAB — CUP PACEART REMOTE DEVICE CHECK
Battery Impedance: 344 Ohm
Battery Remaining Longevity: 85 mo
Brady Statistic AP VP Percent: 94 %
Brady Statistic AP VS Percent: 0 %
Brady Statistic AS VS Percent: 1 %
Implantable Lead Implant Date: 20110621
Implantable Lead Model: 5076
Implantable Lead Model: 5076
Lead Channel Impedance Value: 407 Ohm
Lead Channel Impedance Value: 512 Ohm
Lead Channel Pacing Threshold Amplitude: 1.125 V
Lead Channel Pacing Threshold Amplitude: 1.125 V
Lead Channel Pacing Threshold Pulse Width: 0.4 ms
Lead Channel Setting Pacing Amplitude: 2.25 V
Lead Channel Setting Pacing Amplitude: 2.5 V
Lead Channel Setting Sensing Sensitivity: 5.6 mV
MDC IDC LEAD IMPLANT DT: 20110621
MDC IDC LEAD LOCATION: 753859
MDC IDC LEAD LOCATION: 753860
MDC IDC MSMT BATTERY VOLTAGE: 2.79 V
MDC IDC MSMT LEADCHNL RV PACING THRESHOLD PULSEWIDTH: 0.4 ms
MDC IDC PG IMPLANT DT: 20110621
MDC IDC SESS DTM: 20180806195729
MDC IDC SET LEADCHNL RV PACING PULSEWIDTH: 0.4 ms
MDC IDC STAT BRADY AS VP PERCENT: 4 %

## 2017-08-07 DIAGNOSIS — I469 Cardiac arrest, cause unspecified: Secondary | ICD-10-CM | POA: Diagnosis not present

## 2017-08-11 DIAGNOSIS — 419620001 Death: Secondary | SNOMED CT | POA: Diagnosis not present

## 2017-08-11 DEATH — deceased

## 2018-07-25 IMAGING — DX DG KNEE 1-2V*R*
2 series · 2 of 2 positions shown · non-contrast
Comparison: None.

CLINICAL DATA: Left knee pain and weakness since this morning.

EXAM:
RIGHT KNEE - 1-2 VIEW

[knee ap]
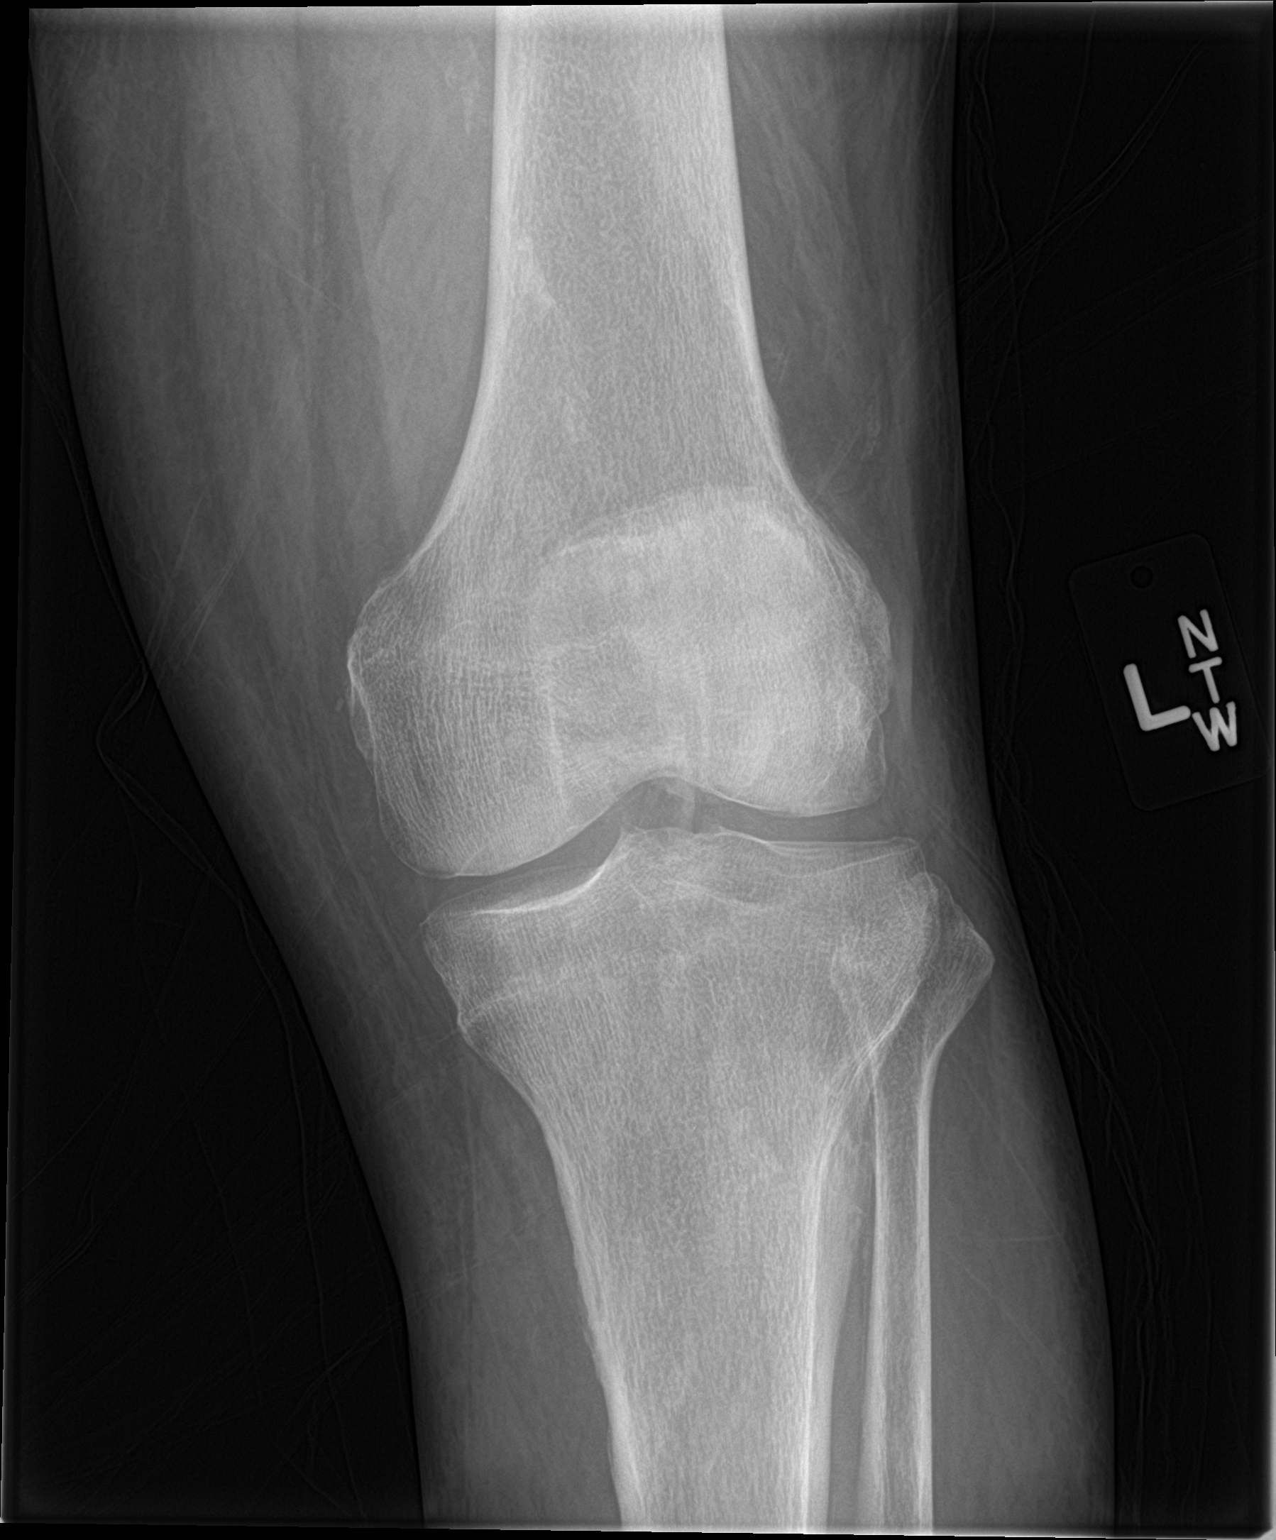

[knee lat]
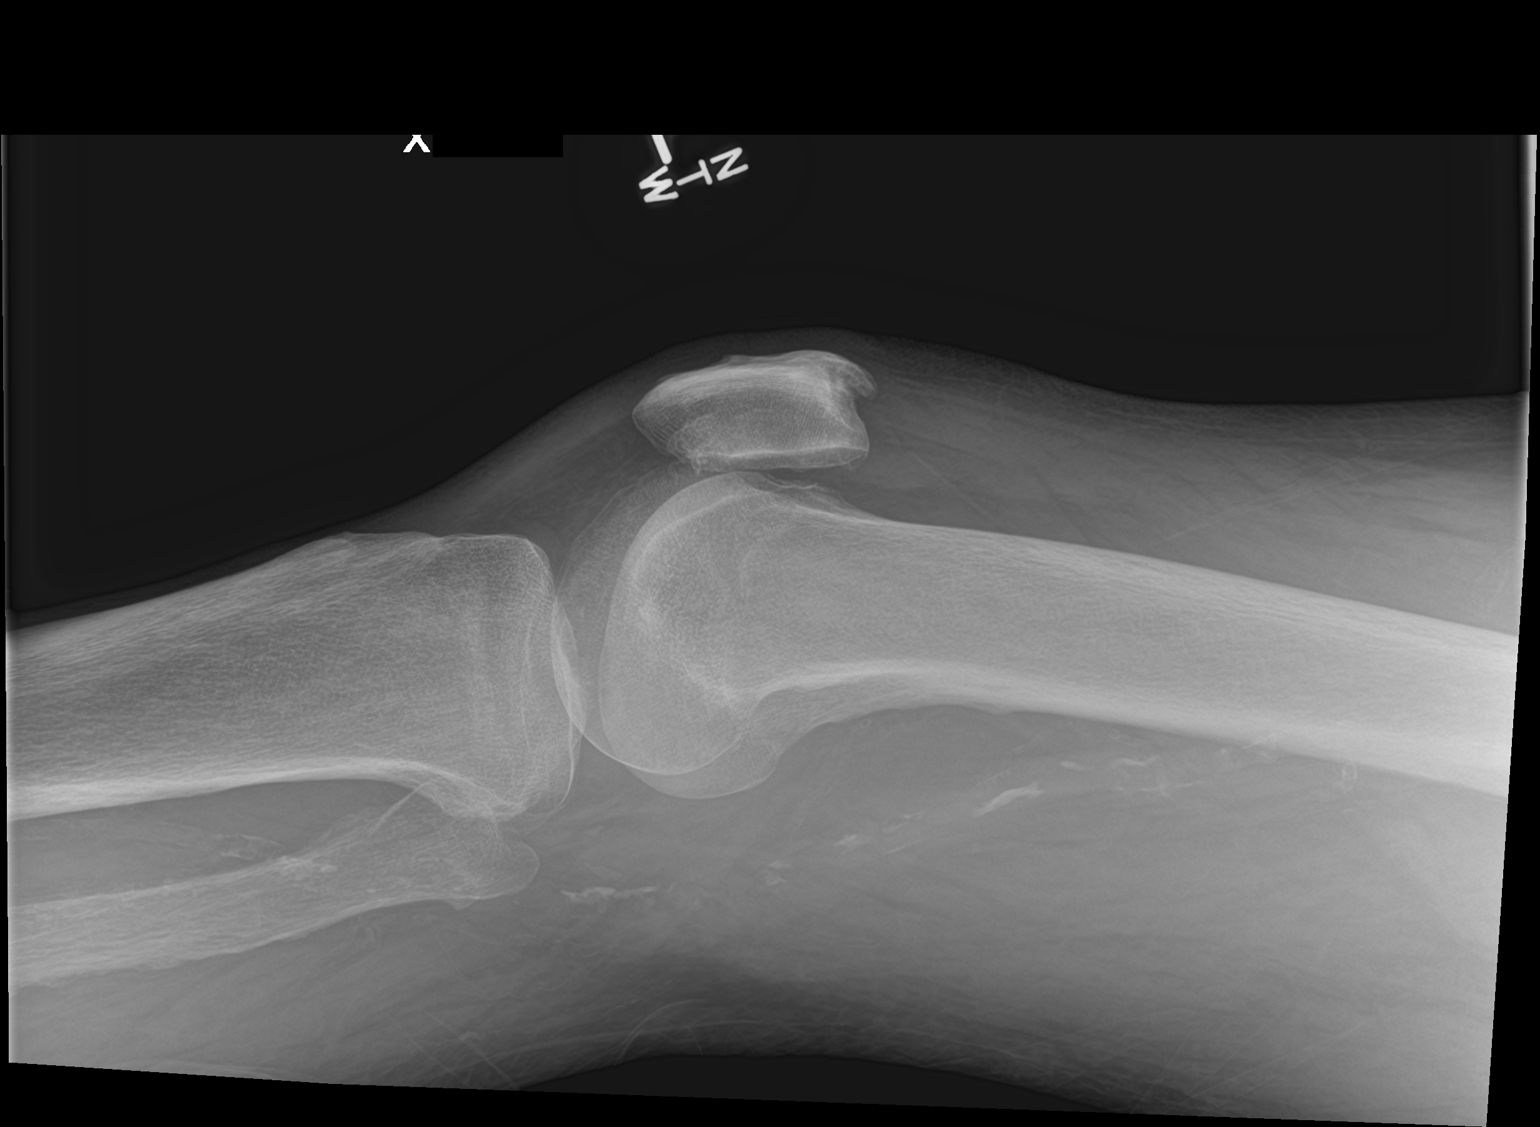

[2 of 2 positions shown; findings below may reference images not displayed]

FINDINGS: Mild tricompartmental degenerative changes. No acute fracture. No
osteochondral lesion. Small joint effusion. Moderate vascular
calcifications.
IMPRESSION: Mild degenerative changes but no acute fracture.

Small joint effusion.

## 2018-07-25 IMAGING — DX DG KNEE 1-2V*R*
2 series · 2 of 2 positions shown · non-contrast
Comparison: None.

CLINICAL DATA: Pain and weakness.  No known injury.

EXAM:
RIGHT KNEE - 1-2 VIEW

[knee ap]
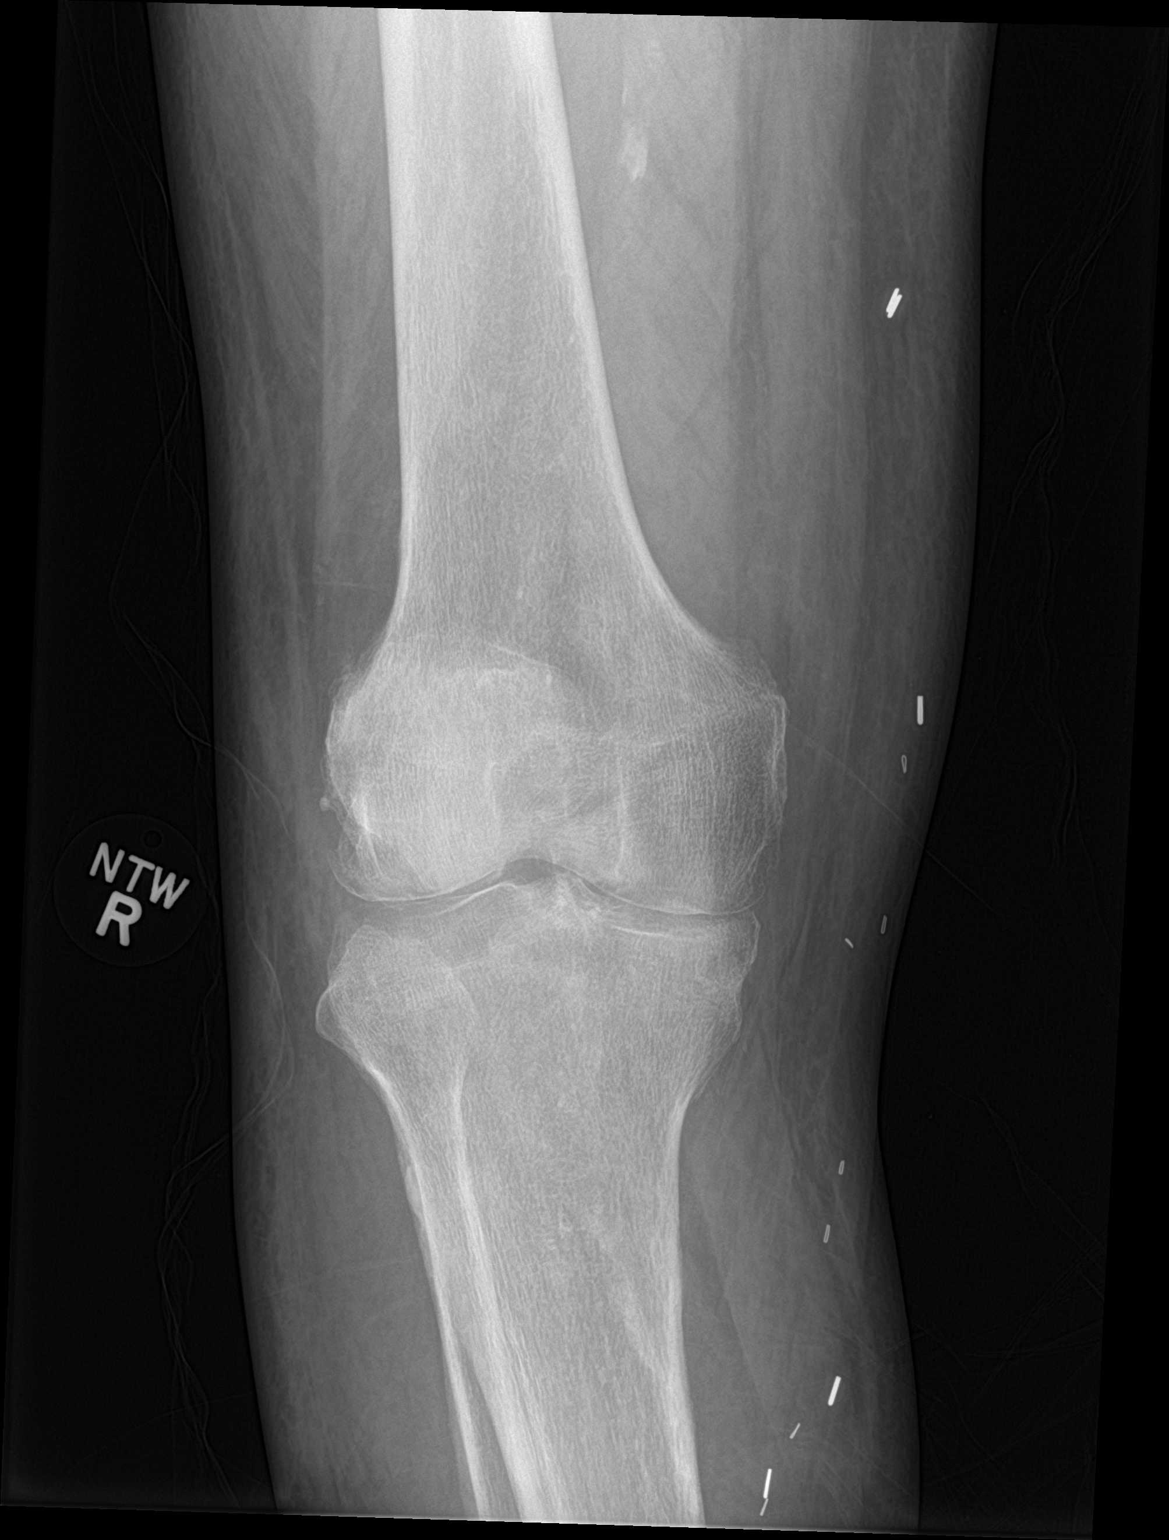

[knee lat]
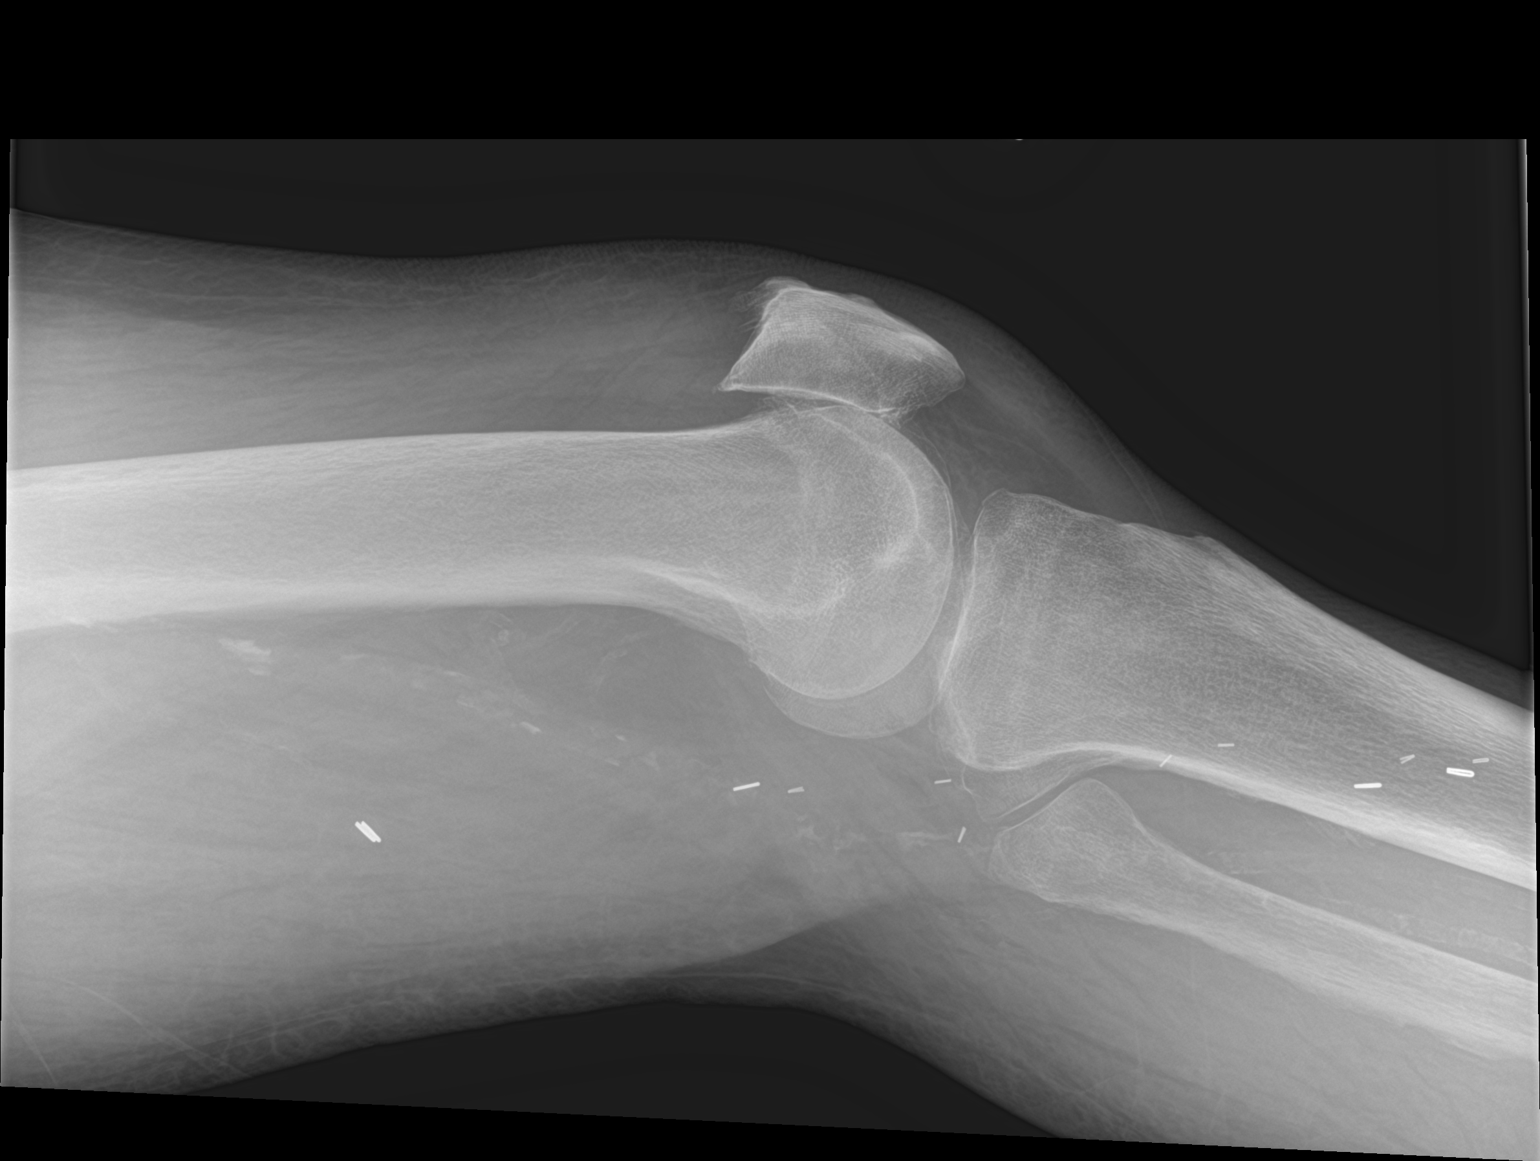

[2 of 2 positions shown; findings below may reference images not displayed]

FINDINGS: Moderate tricompartmental degenerative changes with joint space
narrowing and spurring. No acute fracture or osteochondral lesion.
Small joint effusion. Moderate vascular calcifications.
IMPRESSION: Tricompartmental degenerative changes but no acute bony abnormality.

Small joint effusion.
